# Patient Record
Sex: Female | Born: 2000
Health system: Southern US, Community
[De-identification: ages and names within clinical notes are randomized; demographics above are authoritative.]

## PROBLEM LIST (undated history)

## (undated) ENCOUNTER — Ambulatory Visit: Admission: EM | Payer: Commercial Managed Care - PPO | Source: Home / Self Care

## (undated) DIAGNOSIS — E559 Vitamin D deficiency, unspecified: Secondary | ICD-10-CM

## (undated) DIAGNOSIS — D229 Melanocytic nevi, unspecified: Secondary | ICD-10-CM

## (undated) HISTORY — DX: Vitamin D deficiency, unspecified: E55.9

## (undated) HISTORY — DX: Melanocytic nevi, unspecified: D22.9

---
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
## 2021-04-16 ENCOUNTER — Ambulatory Visit: Payer: Commercial Managed Care - PPO | Admitting: Family Medicine
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
## 2021-04-26 ENCOUNTER — Encounter: Payer: Self-pay | Admitting: Family Medicine

## 2021-04-26 ENCOUNTER — Ambulatory Visit: Payer: Commercial Managed Care - PPO | Admitting: Family Medicine

## 2021-04-26 DIAGNOSIS — Z113 Encounter for screening for infections with a predominantly sexual mode of transmission: Secondary | ICD-10-CM

## 2021-06-01 ENCOUNTER — Ambulatory Visit
Admission: EM | Admit: 2021-06-01 | Discharge: 2021-06-01 | Disposition: A | Discharge status: Home / Self Care | Payer: Commercial Managed Care - PPO | Attending: Emergency Medicine | Admitting: Emergency Medicine

## 2021-06-01 ENCOUNTER — Encounter: Payer: Self-pay | Admitting: Emergency Medicine

## 2021-06-01 ENCOUNTER — Other Ambulatory Visit: Payer: Self-pay

## 2021-06-01 ENCOUNTER — Ambulatory Visit: Payer: Self-pay | Admitting: *Deleted

## 2021-06-01 DIAGNOSIS — J029 Acute pharyngitis, unspecified: Secondary | ICD-10-CM | POA: Diagnosis present

## 2021-06-01 DIAGNOSIS — R6889 Other general symptoms and signs: Secondary | ICD-10-CM | POA: Diagnosis present

## 2021-06-01 DIAGNOSIS — R6883 Chills (without fever): Secondary | ICD-10-CM | POA: Diagnosis present

## 2021-06-01 DIAGNOSIS — Z20822 Contact with and (suspected) exposure to covid-19: Secondary | ICD-10-CM | POA: Insufficient documentation

## 2021-06-01 DIAGNOSIS — R051 Acute cough: Secondary | ICD-10-CM | POA: Diagnosis not present

## 2021-06-01 DIAGNOSIS — N644 Mastodynia: Secondary | ICD-10-CM | POA: Diagnosis not present

## 2021-06-01 LAB — RAPID INFLUENZA A&B ANTIGENS
Influenza A (ARMC): NEGATIVE
Influenza B (ARMC): NEGATIVE

## 2021-06-01 MED ORDER — NAPROXEN SODIUM 550 MG PO TABS: 550.0000 mg | ORAL_TABLET | Freq: Two times a day (BID) | ORAL | 0 refills | Status: DC | PRN

## 2021-06-01 MED ORDER — OSELTAMIVIR PHOSPHATE 75 MG PO CAPS: 75.0000 mg | ORAL_CAPSULE | Freq: Two times a day (BID) | ORAL | 0 refills | Status: AC

## 2021-06-01 NOTE — Telephone Encounter (Signed)
Pt went to UC for all symptoms

## 2021-06-01 NOTE — Discharge Instructions (Addendum)
Recommend start Tamiflu 75mg  twice a day as directed. May take Naproxen 550mg  every 12 hours as needed for pain/inflammation. Continue to push fluids to help loosen up mucus in sinuses and chest. Rest. Follow-up pending COVID 19 test results and in 3 to 4 days if not improving.

## 2021-06-01 NOTE — ED Triage Notes (Signed)
Patient reports breast and nipple pain that started on Tuesday.  Patient reports cough, runny nose, and chills that started on Wed.  Patient has not taken her temperature at home.

## 2021-06-01 NOTE — Telephone Encounter (Signed)
Pt called in c/o having shortness of breath, a sore throat a non productive cough and her left nipple being sore.  Also c/o her lower back hurting and a runny nose and chills.   Has not tested for Covid.  At first she said both of her nipples were hurting real bad but then at the end of the conversation she said it was only the left nipple.  No discharge, not breast feeding or pregnant.   On birth control.  Recent pregnancy test was negative.  This started on Tuesday her other URI symptoms started yesterday.    While trying to get her an appt with [place] the line went dead.   Not sure if she hung up or we got disconnected.   She was in a very noisy environment.  I had difficulty understanding everything she said and had to ask her to repeat several things.  I called her back.   "I'm sorry I accidentally hung up".  She called in before the office opened so I'm sending a note to see if she can be scheduled with Serafina Royals, NP for today.  [facility] does not schedule for her yet.   Pt was agreeable to this plan.  I instructed pt to go to the urgent care or ED if the shortness of breath became worse or she developed chest pain or a worsening of her symptoms.   She verbalized understanding.  Notes sent to [facility] for scheduling.g

## 2021-06-01 NOTE — Telephone Encounter (Signed)
Reason for Disposition  [1] MILD difficulty breathing (e.g., minimal/no SOB at rest, SOB with walking, pulse <100) AND [2] NEW-onset or WORSE than normal  Answer Assessment - Initial Assessment Questions 1. RESPIRATORY STATUS: "Describe your breathing?" (e.g., wheezing, shortness of breath, unable to speak, severe coughing)      Pt calling in c/o shortness of breath and coughing and sore throat.   My nipples are hurting real bad too.   2. ONSET: "When did this breathing problem begin?"      Yesterday started my symptoms.   I don't know if the nipple soreness is related or not.   I'm not breast feeding and I don't think I'm pregnant.    I am using birth control but it's not new.   3. PATTERN "Does the difficult breathing come and go, or has it been constant since it started?"      When standing or walking a long distance and after I cough I'm short of breath.  My back is hurting and I'm having cold chills.    My lower back.  4. SEVERITY: "How bad is your breathing?" (e.g., mild, moderate, severe)    - MILD: No SOB at rest, mild SOB with walking, speaks normally in sentences, can lie down, no retractions, pulse < 100.    - MODERATE: SOB at rest, SOB with minimal exertion and prefers to sit, cannot lie down flat, speaks in phrases, mild retractions, audible wheezing, pulse 100-120.    - SEVERE: Very SOB at rest, speaks in single words, struggling to breathe, sitting hunched forward, retractions, pulse > 120      Mild 5. RECURRENT SYMPTOM: "Have you had difficulty breathing before?" If Yes, ask: "When was the last time?" and "What happened that time?"      No   6. CARDIAC HISTORY: "Do you have any history of heart disease?" (e.g., heart attack, angina, bypass surgery, angioplasty)      No heart problems 7. LUNG HISTORY: "Do you have any history of lung disease?"  (e.g., pulmonary embolus, asthma, emphysema)     No lung problems No sick exposures.    8. CAUSE: "What do you think is causing the  breathing problem?"      I don't know.   I'm gay I don't think that has anything to do with it. 9. OTHER SYMPTOMS: "Do you have any other symptoms? (e.g., dizziness, runny nose, cough, chest pain, fever)     No diarrhea or vomiting.   Coughing non productive.    No fever.  I'm not hot.  I've used cough drops for the coughing.    10. O2 SATURATION MONITOR:  "Do you use an oxygen saturation monitor (pulse oximeter) at home?" If Yes, "What is your reading (oxygen level) today?" "What is your usual oxygen saturation reading?" (e.g., 95%)       No 11. PREGNANCY: "Is there any chance you are pregnant?" "When was your last menstrual period?"       I did a pregnancy test the other day and it was negative.    No nipple discharge or soreness.   It's just my left nipple.  Hurting since Tuesday.    I've not had a period for a year. 12. TRAVEL: "Have you traveled out of the country in the last month?" (e.g., travel history, exposures)       No sick exposures or travels.  Protocols used: Breathing Difficulty-A-AH

## 2021-06-01 NOTE — Telephone Encounter (Signed)
Pt declined appt when I let her know it would need be virtual
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
## 2021-06-02 LAB — SARS CORONAVIRUS 2 (TAT 6-24 HRS): SARS Coronavirus 2: NEGATIVE

## 2021-07-20 ENCOUNTER — Encounter: Payer: Self-pay | Admitting: Nurse Practitioner

## 2021-07-20 ENCOUNTER — Ambulatory Visit (INDEPENDENT_AMBULATORY_CARE_PROVIDER_SITE_OTHER): Payer: Commercial Managed Care - PPO | Admitting: Nurse Practitioner

## 2021-07-20 ENCOUNTER — Other Ambulatory Visit (HOSPITAL_COMMUNITY)
Admission: RE | Admit: 2021-07-20 | Discharge: 2021-07-20 | Disposition: A | Discharge status: Home / Self Care | Payer: Commercial Managed Care - PPO | Source: Ambulatory Visit | Attending: Family Medicine | Admitting: Family Medicine

## 2021-07-20 ENCOUNTER — Other Ambulatory Visit: Payer: Self-pay

## 2021-07-20 VITALS — BP 110/70 | HR 80 | Temp 97.6°F | Resp 18 | Ht 65.0 in | Wt 92.6 lb

## 2021-07-20 DIAGNOSIS — Z1159 Encounter for screening for other viral diseases: Secondary | ICD-10-CM

## 2021-07-20 DIAGNOSIS — Z118 Encounter for screening for other infectious and parasitic diseases: Secondary | ICD-10-CM | POA: Insufficient documentation

## 2021-07-20 DIAGNOSIS — Z131 Encounter for screening for diabetes mellitus: Secondary | ICD-10-CM | POA: Diagnosis not present

## 2021-07-20 DIAGNOSIS — Z130 Encounter for screening for diseases of the blood and blood-forming organs and certain disorders involving the immune mechanism: Secondary | ICD-10-CM | POA: Diagnosis not present

## 2021-07-20 DIAGNOSIS — Z0000 Encounter for general adult medical examination without abnormal findings: Secondary | ICD-10-CM

## 2021-07-20 DIAGNOSIS — Z13220 Encounter for screening for lipoid disorders: Secondary | ICD-10-CM

## 2021-07-20 NOTE — Progress Notes (Addendum)
Name: Melissa Hensley   MRN: [id number]    DOB: 2000/07/31   Date:07/20/2021       Progress Note  Subjective  Chief Complaint  Chief Complaint  Patient presents with   Annual Exam    HPI  Patient presents for annual CPE. Upon entering room, patient was not happy with this provider.  She would give one word answers.  Discussed how the physical would go.  Discussed lab work recommendations that patient later declined.  She initially declined breast exam and then changed her mind so that was completed.   Diet: fast food, does not eat a well balanced diet, does not intake dairy Exercise: gym 2 days a week, walking everyday Recommended 150 min a week of physical activity  Social determinants:  Patient declined to answer most questions.   [place] Office Visit from 07/20/2021 in [facility]  AUDIT-C Score 1      Depression: Phq 9 is  negative Depression screen [facility] 2/9 07/20/2021 09/13/2020 07/10/2020 07/05/2020 01/06/2020  Decreased Interest 0 0 0 0 0  Down, Depressed, Hopeless 0 0 0 0 0  PHQ - 2 Score 0 0 0 0 0  Altered sleeping 0 0 - 0 0  Tired, decreased energy 0 0 - 0 1  Change in appetite 0 0 - 0 0  Feeling bad or failure about yourself  0 0 - 0 0  Trouble concentrating 0 0 - 0 0  Moving slowly or fidgety/restless 0 0 - 0 0  Suicidal thoughts 0 0 - 0 0  PHQ-9 Score 0 0 - 0 1  Difficult doing work/chores Not difficult at all - - - Not difficult at all  Some recent data might be hidden   Hypertension: BP Readings from Last 3 Encounters:  07/20/21 110/70  06/01/21 114/75  09/13/20 96/78   Obesity: Wt Readings from Last 3 Encounters:  07/20/21 92 lb 9.6 oz (42 kg) (<1 %, Z= -2.53)*  06/01/21 100 lb (45.4 kg) (4 %, Z= -1.78)*  09/13/20 92 lb 9.6 oz (42 kg) (<1 %, Z= -2.47)*   * Growth percentiles are based on CDC (Girls, 2-20 Years) data.   BMI Readings from Last 3 Encounters:  07/20/21 15.41 kg/m (<1 %, Z= -3.46)*  06/01/21 16.64 kg/m (<1 %, Z=  -2.41)*  09/13/20 15.41 kg/m (<1 %, Z= -3.36)*   * Growth percentiles are based on CDC (Girls, 2-20 Years) data.     Vaccines:  HPV: up to at age 66 , ask insurance if age between 43-45  Shingrix: 43-64 yo and ask insurance if covered when patient above 5 yo Pneumonia:  educated and discussed with patient. Flu: educated and discussed with patient.  Hep C Screening: ordered STD testing and prevention (HIV/chl/gon/syphilis): 07/05/2019 Intimate partner violence:none Sexual History : yes, one partner Menstrual History/LMP/Abnormal Bleeding: [facility]: a year ago on Birth control Nexplanon Incontinence Symptoms: none  Breast cancer:  - Last Mammogram: no concerns, does not qualify - BRCA gene screening: none  Osteoporosis: Discussed high calcium and vitamin D supplementation, weight bearing exercises  Cervical cancer screening: no concerns, does not qualify  Skin cancer: Discussed monitoring for atypical lesions  Colorectal cancer: no concerns, does not qualify  Lung cancer:  Low Dose CT Chest recommended if Age 30-80 years, 20 pack-year currently smoking OR have quit w/in 15years. Patient does not qualify.   ECG: none  Advanced Care Planning: A voluntary discussion about advance care planning including the explanation and discussion of advance  directives.  Discussed health care proxy and Living will, and the patient was able to identify a health care proxy as Mom.  Patient does not have a living will at present time. If patient does have living will, I have requested they bring this to the clinic to be scanned in to their chart.  Lipids: Lab Results  Component Value Date   CHOL 135 07/05/2019   CHOL 121 07/03/2018   No results found for: HDL No results found for: LDLCALC No results found for: TRIG No results found for: CHOLHDL No results found for: LDLDIRECT  Glucose: Glucose, Bld  Date Value Ref Range Status  07/05/2019 89 65 - 99 mg/dL Final    Comment:    .             Fasting reference interval .     Patient Active Problem List   Diagnosis Date Noted   Vapes nicotine containing substance 11/24/2020   Plantar fasciitis 09/13/2020   Moderate episode of recurrent major depressive disorder ([place]) 08/26/2018   Grieving 08/26/2018   Problem with child being bullied 08/26/2018   Picky eater 04/23/2016   Vitamin D deficiency 04/23/2016    No past surgical history on file.  Family History  Problem Relation Age of Onset   Diabetes Paternal Uncle     Social History   Socioeconomic History   Marital status: Single    Spouse name: Not on file   Number of children: 0   Years of education: Not on file   Highest education level: Not on file  Occupational History   Occupation: student   Tobacco Use   Smoking status: Never   Smokeless tobacco: Never   Tobacco comments:    vapes  Vaping Use   Vaping Use: Every day  Substance and Sexual Activity   Alcohol use: No   Drug use: No   Sexual activity: Yes    Partners: Male    Birth control/protection: Implant  Other Topics Concern   Not on file  Social History Narrative   Parents divorced    Moved back with her mother and one brother    [employer]    Social Determinants of Health   Financial Resource Strain: Unknown   Difficulty of Paying Living Expenses: Patient refused  Food Insecurity: Unknown   Worried About Charity fundraiser in the Last Year: Patient refused   Arboriculturist in the Last Year: Patient refused  Transportation Needs: Unknown   Film/video editor (Medical): Patient refused   Lack of Transportation (Non-Medical): Patient refused  Physical Activity: Unknown   Days of Exercise per Week: Patient refused   Minutes of Exercise per Session: Patient refused  Stress: Unknown   Feeling of Stress : Patient refused  Social Connections: Unknown   Frequency of Communication with Friends and Family: Patient refused   Frequency of Social Gatherings with Friends and Family:  Patient refused   Attends Religious Services: Patient refused   Marine scientist or Organizations: Patient refused   Attends Music therapist: Patient refused   Marital Status: Patient refused  Intimate Production manager Violence: Unknown   Fear of Current or Ex-Partner: Patient refused   Emotionally Abused: Patient refused   Physically Abused: Patient refused   Sexually Abused: Patient refused     Current Outpatient Medications:    etonogestrel (NEXPLANON) 68 MG IMPL implant, 68 mg by Subdermal route once., Disp: , Rfl:    naproxen sodium (ANAPROX) 550  MG tablet, Take 1 tablet (550 mg total) by mouth every 12 (twelve) hours as needed for moderate pain. Take with food (Patient not taking: Reported on 07/20/2021), Disp: 15 tablet, Rfl: 0  Allergies  Allergen Reactions   Red Dye Diarrhea    ROS  Constitutional: Negative for fever or weight change.  Respiratory: Negative for cough and shortness of breath.   Cardiovascular: Negative for chest pain or palpitations.  Gastrointestinal: Negative for abdominal pain, no bowel changes.  Musculoskeletal: Negative for gait problem or joint swelling.  Skin: Negative for rash.  Neurological: Negative for dizziness or headache.  No other specific complaints in a complete review of systems (except as listed in HPI above).   Objective  Vitals:   07/20/21 0814  BP: 110/70  Pulse: 80  Resp: 18  Temp: 97.6 F (36.4 C)  TempSrc: Oral  SpO2: 99%  Weight: 92 lb 9.6 oz (42 kg)  Height: '5\' 5"'  (1.651 m)    Body mass index is 15.41 kg/m.  Physical Exam  Constitutional: Patient appears well-developed and well-nourished. No distress.  HENT: Head: Normocephalic and atraumatic. Ears: B TMs ok, no erythema or effusion; Nose: Nose normal. Mouth/Throat: not done Eyes: Conjunctivae and EOM are normal. Pupils are equal, round, and reactive to light. No scleral icterus.  Neck: Normal range of motion. Neck supple. No JVD present. No  thyromegaly present.  Cardiovascular: Normal rate, regular rhythm and normal heart sounds.  No murmur heard. No BLE edema. Pulmonary/Chest: Effort normal and breath sounds normal. No respiratory distress. Abdominal: Soft. Bowel sounds are normal, no distension. There is no tenderness. no masses Breast: no lumps or masses, no nipple discharge or rashes FEMALE GENITALIA: not done RECTAL: not done Musculoskeletal: Normal range of motion, no joint effusions. No gross deformities Neurological: he is alert and oriented to person, place, and time. No cranial nerve deficit. Coordination, balance, strength, speech and gait are normal.  Skin: Skin is warm and dry. No rash noted. No erythema.  Psychiatric: Patient has a normal mood and affect. behavior is normal. Judgment and thought content normal.   Recent Results (from the past 2160 hour(s))  Rapid Influenza A&B Antigens     Status: None   Collection Time: 06/01/21 11:24 AM   Specimen: Flu Kit Nasopharyngeal Swab; Respiratory  Result Value Ref Range   Influenza A ([facility]) NEGATIVE NEGATIVE   Influenza B ([facility]) NEGATIVE NEGATIVE    Comment: Negative results do not exclude influenza virus infection, and influenza should still be considered if clinical suspicion is high. Performed at [facility], [address], [place], [place] [address]   SARS CORONAVIRUS 2 (TAT 6-24 HRS) Nasopharyngeal Nasopharyngeal Swab     Status: None   Collection Time: 06/01/21 11:24 AM   Specimen: Nasopharyngeal Swab  Result Value Ref Range   SARS Coronavirus 2 NEGATIVE NEGATIVE    Comment: (NOTE) SARS-CoV-2 target nucleic acids are NOT DETECTED.  The SARS-CoV-2 RNA is generally detectable in upper and lower respiratory specimens during the acute phase of infection. Negative results do not preclude SARS-CoV-2 infection, do not rule out co-infections with other pathogens, and should not be used as the sole basis for treatment or other patient management  decisions. Negative results must be combined with clinical observations, patient history, and epidemiological information. The expected result is Negative.  Fact Sheet for Patients: SugarRoll.be  Fact Sheet for Healthcare Providers: https://www.woods-mathews.com/  This test is not yet approved or cleared by the [place] FDA and  has  been authorized for detection and/or diagnosis of SARS-CoV-2 by FDA under an Emergency Use Authorization (EUA). This EUA will remain  in effect (meaning this test can be used) for the duration of the COVID-19 declaration under Se ction 564(b)(1) of the Act, 21 U.S.C. section 360bbb-3(b)(1), unless the authorization is terminated or revoked sooner.  Performed at [facility], [place] [address], [place], [place] [address]      Fall Risk: Fall Risk  07/20/2021 09/13/2020 07/10/2020 07/05/2020 01/06/2020  Falls in the past year? 0 0 0 0 0  Number falls in past yr: 0 0 0 0 0  Injury with Fall? 0 0 0 0 0  Follow up Falls evaluation completed - Falls evaluation completed - -    Functional Status Survey: Is the patient deaf or have difficulty hearing?: No Does the patient have difficulty seeing, even when wearing glasses/contacts?: No Does the patient have difficulty concentrating, remembering, or making decisions?: No Does the patient have difficulty walking or climbing stairs?: No Does the patient have difficulty dressing or bathing?: No Does the patient have difficulty doing errands alone such as visiting a doctor's office or shopping?: No   Assessment & Plan  1. Annual physical exam  - CBC with Differential/Platelet - COMPLETE METABOLIC PANEL WITH GFR - Lipid panel - Hemoglobin A1c - Hepatitis C antibody  2. Encounter for hepatitis C screening test for low risk patient  - Hepatitis C antibody  3. Screening for deficiency anemia  - CBC with Differential/Platelet  4. Screening for diabetes  mellitus  - COMPLETE METABOLIC PANEL WITH GFR - Hemoglobin A1c  5. Screening for cholesterol level  - COMPLETE METABOLIC PANEL WITH GFR - Lipid panel  6. Screening for chlamydial disease  - Urine cytology ancillary only   -USPSTF grade A and B recommendations reviewed with patient; age-appropriate recommendations, preventive care, screening tests, etc discussed and encouraged; healthy living encouraged; see AVS for patient education given to patient -Discussed importance of 150 minutes of physical activity weekly, eat two servings of fish weekly, eat one serving of tree nuts ( cashews, pistachios, pecans, almonds.Marland Kitchen) every other day, eat 6 servings of fruit/vegetables daily and drink plenty of water and avoid sweet beverages.

## 2021-07-21 ENCOUNTER — Other Ambulatory Visit: Payer: Self-pay | Admitting: Nurse Practitioner

## 2021-07-21 DIAGNOSIS — A749 Chlamydial infection, unspecified: Secondary | ICD-10-CM

## 2021-07-21 LAB — URINE CYTOLOGY ANCILLARY ONLY
Chlamydia: POSITIVE — AB
Comment: NEGATIVE
Comment: NORMAL
Neisseria Gonorrhea: NEGATIVE

## 2021-07-21 MED ORDER — DOXYCYCLINE HYCLATE 100 MG PO TABS: 100.0000 mg | ORAL_TABLET | Freq: Two times a day (BID) | ORAL | 0 refills | Status: AC

## 2021-09-17 ENCOUNTER — Ambulatory Visit: Payer: Self-pay | Admitting: *Deleted

## 2021-09-17 ENCOUNTER — Telehealth: Payer: Self-pay

## 2021-09-17 NOTE — Telephone Encounter (Signed)
I called pt to ask if she would like to see another dr before 09/24/21 and pt stated no she would wait for dr Ancil Boozer ?

## 2021-09-17 NOTE — Telephone Encounter (Signed)
Summary: lump on her breast sore to touch  ? Pt mother called in and stated pt found a lump on her breast that was sore to touch last week.  ? ?Pt mother stated they did not want to come in for an appointment; they are seeking clinical advice and a referral to a specialist.   ? ?Sent referral request to the practice.  ?  ? ? ? ?Chief Complaint: requesting referral ?Symptoms: right lump on breast, sore to touch  ?Frequency: x 1 week  ?Pertinent Negatives: Patient denies no fever, no discharge from nipple, no rash reported no redness reported  ?Disposition: '[]'$ ED /'[]'$ Urgent Care (no appt availability in office) / '[x]'$ Appointment(In office/virtual)/ '[]'$  [place] [facility]/ '[]'$ Home Care/ '[]'$ Refused Recommended Disposition /'[]'$ [place] [facility]/ '[]'$  Follow-up with PCP ?Additional Notes:  ? ?Requesting if referral is needed for specialist, already seen 08/03/20. Appt scheduled 09/24/21.  Earlier appt noted with PCP.  Please advise if earlier appt can be given after 3 pm.  ? ? ? ? ? ?Reason for Disposition ? Breast lump ? ?Answer Assessment - Initial Assessment Questions ?1. SYMPTOM: "What's the main symptom you're concerned about?"  (e.g., lump, pain, rash, nipple discharge) ?    Lump and soreness to touch  ?2. LOCATION: "Where is the pain/ lump located?" ?    Right breast  ?3. ONSET: "When did pain/ lump  start?" ?    One week ago  ?4. PRIOR HISTORY: "Do you have any history of prior problems with your breasts?" (e.g., lumps, cancer, fibrocystic breast disease) ?    Lump  ?5. CAUSE: "What do you think is causing this symptom?" ?    Not sure , started menstrual cycle Monday last week  ?6. OTHER SYMPTOMS: "Do you have any other symptoms?" (e.g., fever, breast pain, redness or rash, nipple discharge) ?    Breast pain/ soreness to touch, lump ?7. PREGNANCY-BREASTFEEDING: "Is there any chance you are pregnant?" "When was your last menstrual period?" "Are you breastfeeding?" ?    na ? ?Protocols used: Breast Symptoms-A-AH ? ?

## 2021-09-17 NOTE — Telephone Encounter (Signed)
Called pt and offered Thursday appt and pt declined ?

## 2021-09-17 NOTE — Telephone Encounter (Signed)
Copied from [facility] [id number]. Topic: Referral - Request for Referral ?>> Sep 17, 2021 11:22 AM McGill, Nelva Bush wrote: ?Has patient seen PCP for this complaint? Yes.   ?*If NO, is insurance requiring patient see PCP for this issue before PCP can refer them? ?Referral for which specialty: N/A Pt mother stated PCP sent pt somewhere in December 2022.  ?Preferred provider/office: Dr. Fleet Contras at [facility] ?Reason for referral:pt found a lump on her breast that was sore to touch last week. ?

## 2021-09-17 NOTE — Telephone Encounter (Signed)
Called pt to offer thursdays appt and she declined it ?

## 2021-09-20 ENCOUNTER — Encounter: Payer: Self-pay | Admitting: Advanced Practice Midwife

## 2021-09-20 ENCOUNTER — Ambulatory Visit: Payer: Commercial Managed Care - PPO | Admitting: Advanced Practice Midwife

## 2021-09-20 ENCOUNTER — Other Ambulatory Visit: Payer: Self-pay

## 2021-09-20 DIAGNOSIS — Z113 Encounter for screening for infections with a predominantly sexual mode of transmission: Secondary | ICD-10-CM

## 2021-09-20 LAB — WET PREP FOR TRICH, YEAST, CLUE
Trichomonas Exam: NEGATIVE
Yeast Exam: NEGATIVE

## 2021-09-20 NOTE — Progress Notes (Signed)
Name: Melissa Hensley   MRN: [id number]    DOB: [birth year]   Date:09/24/2021 ? ?     Progress Note ? ?Subjective ? ?Chief Complaint ? ?Breast Lump- Right ? ?HPI ? ?Right breast lump: she noticed a lump on right breast about two weeks ago it is tender to touch, no nipple discharge, denies change of skin or redness.  ? ?She has a history of breast lumps in the past ? ?She went one year without a cycle but had one about one month ago and again over this past weekend ? ?She has Nexplanon for a while, no new medications.  ? ?Patient Active Problem List  ? Diagnosis Date Noted  ? Vapes nicotine containing substance 11/24/2020  ? Plantar fasciitis 09/13/2020  ? Moderate episode of recurrent major depressive disorder ([place]) 08/26/2018  ? Grieving 08/26/2018  ? Problem with child being bullied 08/26/2018  ? Picky eater 04/23/2016  ? Vitamin D deficiency 04/23/2016  ? ? ?No past surgical history on file. ? ?Family History  ?Problem Relation Age of Onset  ? Diabetes Paternal Uncle   ? ? ?Social History  ? ?Tobacco Use  ? Smoking status: Every Day  ?  Types: E-cigarettes, Cigars  ? Smokeless tobacco: Never  ? Tobacco comments:  ?  vapes  ?Substance Use Topics  ? Alcohol use: No  ? ? ? ?Current Outpatient Medications:  ?  etonogestrel (NEXPLANON) 68 MG IMPL implant, 68 mg by Subdermal route once., Disp: , Rfl:  ? ?Allergies  ?Allergen Reactions  ? Red Dye Diarrhea  ? ? ?I personally reviewed active problem list, medication list, allergies, family history, social history, health maintenance with the patient/caregiver today. ? ? ?ROS ? ?Ten systems reviewed and is negative except as mentioned in HPI  ? ?Objective ? ?Vitals:  ? 09/24/21 1552  ?BP: 112/72  ?Pulse: 86  ?Resp: 16  ?SpO2: 99%  ?Weight: 98 lb (44.5 kg)  ?Height: '5\' 5"'$  (1.651 m)  ? ? ?Body mass index is 16.31 kg/m?. ? ?Physical Exam ? ?Constitutional: Patient appears well-developed and very thin No distress.  ?HEENT: head atraumatic, normocephalic, pupils equal and reactive to  light, neck supple, ?Cardiovascular: Normal rate, regular rhythm and normal heart sounds.  No murmur heard. No BLE edema. ?Breast : multiple pea size nodules on both breasts, no nipple discharge, skin intact, the area of discomfort, near right axilla was normal to exam, she states seems to be going away ?Pulmonary/Chest: Effort normal and breath sounds normal. No respiratory distress. ?Abdominal: Soft.  There is no tenderness. ?Psychiatric: Patient has a normal mood and affect. behavior is normal. Judgment and thought content normal.  ? ?Recent Results (from the past 2160 hour(s))  ?Urine cytology ancillary only     Status: Abnormal  ? Collection Time: 07/20/21  8:41 AM  ?Result Value Ref Range  ? Neisseria Gonorrhea Negative   ? Chlamydia Positive (A)   ? Comment Normal Reference [place] Chlamydia - Negative   ? Comment    ?  Normal Reference Range Neisseria Gonorrhea - Negative  ?WET PREP FOR [place], YEAST, CLUE     Status: None  ? Collection Time: 09/20/21  5:01 PM  ?Result Value Ref Range  ? Trichomonas Exam Negative Negative  ? Yeast Exam Negative Negative  ? Clue Cell Exam Comment: Negative  ?  Comment: NEG;AMINE NEG  ? ? ?PHQ2/9: ?Depression screen [facility] 2/9 09/24/2021 07/20/2021 09/13/2020 07/10/2020 07/05/2020  ?Decreased Interest 0 0 0 0 0  ?Down, Depressed, Hopeless 0  0 0 0 0  ?PHQ - 2 Score 0 0 0 0 0  ?Altered sleeping 0 0 0 - 0  ?Tired, decreased energy 0 0 0 - 0  ?Change in appetite 0 0 0 - 0  ?Feeling bad or failure about yourself  0 0 0 - 0  ?Trouble concentrating 0 0 0 - 0  ?Moving slowly or fidgety/restless 0 0 0 - 0  ?Suicidal thoughts 0 0 0 - 0  ?PHQ-9 Score 0 0 0 - 0  ?Difficult doing work/chores - Not difficult at all - - -  ?Some recent data might be hidden  ?  ?phq 9 is negative ? ? ?Fall Risk: ?Fall Risk  09/24/2021 07/20/2021 09/13/2020 07/10/2020 07/05/2020  ?Falls in the past year? 0 0 0 0 0  ?Number falls in past yr: 0 0 0 0 0  ?Injury with Fall? 0 0 0 0 0  ?Risk for fall due to : No Fall Risks - - -  -  ?Follow up Falls prevention discussed Falls evaluation completed - Falls evaluation completed -  ? ? ? ? ?Functional Status Survey: ?Is the patient deaf or have difficulty hearing?: No ?Does the patient have difficulty seeing, even when wearing glasses/contacts?: No ?Does the patient have difficulty concentrating, remembering, or making decisions?: No ?Does the patient have difficulty walking or climbing stairs?: No ?Does the patient have difficulty dressing or bathing?: No ?Does the patient have difficulty doing errands alone such as visiting a doctor's office or shopping?: No ? ? ? ?Assessment & Plan ? ?1. Multiple benign lumps of breast ? ?Reassurance given at this time ? ?2. Acute breast pain ?  ?Reassurance given  ?

## 2021-09-20 NOTE — Progress Notes (Signed)
Here today for STD screening. Declines bloodwork. Hal Morales, RN ? ?

## 2021-09-20 NOTE — Progress Notes (Signed)
[facility] ? ?STI clinic/screening visit ?[place] [address] ?[id number] ? ?Subjective:  ?Melissa Hensley is a 21 y.o. SBF nullip vaper female being seen today for an STI screening visit. The patient reports they do not have symptoms.  Patient reports that they do not desire a pregnancy in the next year.   They reported they are not interested in discussing contraception today.   ? ?Patient's last menstrual period was 09/11/2021 (exact date). ? ? ?Patient has the following medical conditions:   ?Patient Active Problem List  ? Diagnosis Date Noted  ? Vapes nicotine containing substance 11/24/2020  ? Plantar fasciitis 09/13/2020  ? Moderate episode of recurrent major depressive disorder ([place]) 08/26/2018  ? Grieving 08/26/2018  ? Problem with child being bullied 08/26/2018  ? Picky eater 04/23/2016  ? Vitamin D deficiency 04/23/2016  ? ? ?Chief Complaint  ?Patient presents with  ? SEXUALLY TRANSMITTED DISEASE  ?  Screening  ? ? ?HPI ? ?Patient reports asymptomatic. LMP 09/11/21. Last sex 09/08/21 without condom; with current partner x 1 year; 1 partner in last 3 mo. Nexplanon inserted 04/05/19. Last cigar 07/2021.  ? ?Last HIV test per patient/review of record was 07/05/20 ?Patient reports last pap was n/a ? ?Screening for MPX risk: ?Does the patient have an unexplained rash? No ?Is the patient MSM? No ?Does the patient endorse multiple sex partners or anonymous sex partners? No ?Did the patient have close or sexual contact with a person diagnosed with MPX? No ?Has the patient traveled outside the [place] where MPX is endemic? No ?Is there a high clinical suspicion for MPX-- evidenced by one of the following No ? -Unlikely to be chickenpox ? -Lymphadenopathy ? -Rash that present in same phase of evolution on any given body part ?See flowsheet for further details and programmatic requirements.  ? ? ?The following portions of the patient's history were reviewed and updated as  appropriate: allergies, current medications, past medical history, past social history, past surgical history and problem list. ? ?Objective:  ?There were no vitals filed for this visit. ? ?Physical Exam ?Vitals and nursing note reviewed.  ?Constitutional:   ?   Appearance: Normal appearance. She is normal weight.  ?HENT:  ?   Head: Normocephalic and atraumatic.  ?   Mouth/Throat:  ?   Mouth: Mucous membranes are moist.  ?   Pharynx: Oropharynx is clear. No oropharyngeal exudate or posterior oropharyngeal erythema.  ?Eyes:  ?   Conjunctiva/sclera: Conjunctivae normal.  ?Pulmonary:  ?   Effort: Pulmonary effort is normal.  ?Abdominal:  ?   General: Abdomen is flat.  ?   Palpations: Abdomen is soft. There is no mass.  ?   Tenderness: There is no abdominal tenderness. There is no rebound.  ?   Comments: Soft without masses or tenderness, good tone  ?Genitourinary: ?   General: Normal vulva.  ?   Exam position: Lithotomy position.  ?   Pubic Area: No rash or pubic lice.   ?   Labia:     ?   Right: No rash or lesion.     ?   Left: No rash or lesion.   ?   Vagina: Vaginal discharge (white creamy leukorrhea, ph<4.5) present. No erythema, bleeding or lesions.  ?   Cervix: Normal.  ?   Uterus: Normal.   ?   Adnexa: Right adnexa normal and left adnexa normal.  ?   Rectum: Normal.  ?Lymphadenopathy:  ?  Head:  ?   Right side of head: No preauricular or posterior auricular adenopathy.  ?   Left side of head: No preauricular or posterior auricular adenopathy.  ?   Cervical: No cervical adenopathy.  ?   Right cervical: No superficial, deep or posterior cervical adenopathy. ?   Left cervical: No superficial, deep or posterior cervical adenopathy.  ?   Upper Body:  ?   Right upper body: No supraclavicular or axillary adenopathy.  ?   Left upper body: No supraclavicular or axillary adenopathy.  ?   Lower Body: No right inguinal adenopathy. No left inguinal adenopathy.  ?Skin: ?   General: Skin is warm and dry.  ?   Findings: No  rash.  ?Neurological:  ?   Mental Status: She is alert and oriented to person, place, and time.  ? ? ? ?Assessment and Plan:  ?Rashad Obeid is a 21 y.o. female presenting to the [facility] Department for STI screening ? ?1. Screening examination for venereal disease ?Treat wet mount per standing orders ?Immunization nurse consult ?- WET PREP FOR [place], YEAST, CLUE ?- Chlamydia/Gonorrhea [facility] ?- Gonococcus culture ? ? ? ? ?No follow-ups on file. ? ?Future Appointments  ?Date Time Provider [place]  ?09/24/2021  3:40 PM Steele Sizer, MD [place] [facility]  ?07/26/2022  8:20 AM Steele Sizer, MD [place] [facility]  ? ? ?Herbie Saxon, CNM ? ?

## 2021-09-20 NOTE — Progress Notes (Signed)
[employer] results reviewed. Per standing order no treatment indicated. Hal Morales, RN ? ?

## 2021-09-24 ENCOUNTER — Ambulatory Visit: Payer: Commercial Managed Care - PPO | Admitting: Family Medicine

## 2021-09-24 ENCOUNTER — Encounter: Payer: Self-pay | Admitting: Family Medicine

## 2021-09-24 VITALS — BP 112/72 | HR 86 | Resp 16 | Ht 65.0 in | Wt 98.0 lb

## 2021-09-24 DIAGNOSIS — N63 Unspecified lump in unspecified breast: Secondary | ICD-10-CM

## 2021-09-24 DIAGNOSIS — N644 Mastodynia: Secondary | ICD-10-CM | POA: Diagnosis not present

## 2021-09-25 LAB — GONOCOCCUS CULTURE

## 2021-09-27 ENCOUNTER — Telehealth: Payer: Self-pay | Admitting: Family Medicine

## 2021-09-27 NOTE — Telephone Encounter (Signed)
Phone call to pt. Pt confirmed password from last visit. Pt provided TR that are available, but also informed that some results are still pending.  [facility] will call about results that are positive or if any problems.  It could be up to 3 weeks before all TR are back. ?

## 2021-10-05 ENCOUNTER — Encounter: Payer: Self-pay | Admitting: Advanced Practice Midwife

## 2021-10-05 ENCOUNTER — Ambulatory Visit (LOCAL_COMMUNITY_HEALTH_CENTER): Payer: Commercial Managed Care - PPO | Admitting: Advanced Practice Midwife

## 2021-10-05 ENCOUNTER — Ambulatory Visit: Payer: Self-pay

## 2021-10-05 ENCOUNTER — Other Ambulatory Visit: Payer: Self-pay

## 2021-10-05 VITALS — BP 107/70 | Ht 65.0 in | Wt 100.4 lb

## 2021-10-05 DIAGNOSIS — Z3009 Encounter for other general counseling and advice on contraception: Secondary | ICD-10-CM

## 2021-10-05 DIAGNOSIS — Z3046 Encounter for surveillance of implantable subdermal contraceptive: Secondary | ICD-10-CM

## 2021-10-05 NOTE — Progress Notes (Signed)
Pt here for Nexplanon removal .  Pt declines any other forms of  birth control.  Windle Guard, RN ? ?

## 2021-10-05 NOTE — Progress Notes (Signed)
[facility] ?[place] ?Main Number: [phone] ? ?Contraception/Family Planning VISIT ENCOUNTER NOTE ? ?Subjective:  ? Melissa Hensley is a 21 y.o. vaper SBF [id number] female here for reproductive life counseling. The patient is currently using Hormonal Implant to prevent pregnancy.   ?Nexplanon inserted 04/05/2019 and pt wants removal with no birth control and doesn't want pregnancy in next year. Last sex 09/01/21 without condom; not with that partner anymore. LMP 09/24/2021. Last [facility]21 at [facility]. Working 40 hrs/wk and living alone. Hasn't eaten anything yet all day (4:35 pm) and has food at home.  ?The patient does not want a pregnancy in the next year.   ? ?Client states they are looking for the following:  Other no birth control at all ? ?Denies abnormal vaginal bleeding, discharge, pelvic pain, problems with intercourse or other gynecologic concerns.  ?  ?Gynecologic History ?Patient's last menstrual period was 09/24/2021 (approximate). ? ?Health Maintenance Due  ?Topic Date Due  ? HPV VACCINES (3 - 3-dose series) 04/27/2015  ? Hepatitis C Screening  Never done  ? ? ? ?The following portions of the patient's history were reviewed and updated as appropriate: allergies, current medications, past family history, past medical history, past social history, past surgical history and problem list. ? ?Review of Systems ?Pertinent items are noted in HPI. ?  ?Objective:  ?BP 107/70   Ht '5\' 5"'$  (1.651 m)   Wt 100 lb 6.4 oz (45.5 kg)   LMP 09/24/2021 (Approximate)   BMI 16.71 kg/m?  ?Gen: well appearing, NAD ?HEENT: no scleral icterus ?CV: RR ?Lung: Normal WOB ?Ext: warm well perfused ? ? ? ? ?Assessment and Plan:  ? ?Need for emergency contraceptive care was assessed today; does not meet criteria ? ?Last unprotected sex was:  09/01/21 without condom.   ? ?Contraception counseling: Reviewed methods in a patient centered fashion and used shared  decision making with the patient. Utilized [employer] patient education tools as appropriate. ?The patient stated there goals and desires from a method are: Other no birth control ? ?We reviewed the following methods in detail based on patient preferences available included: ?No Method - Other Reason ? ?Patient expressed they would like No Method - Other Reason ? ?This was provided to the patient today.  if not why not clearly documented ? ?Risks, benefits, and typical effectiveness rates were reviewed.  Questions were answered.  Written information was also given to the patient to review.   ? ? ?  will follow up in  1 years for surveillance.   was told to call with any further questions, or with any concerns about this method of contraception or cycle control.  Emphasized use of condoms 100% of the time for STI prevention. ? ? ?1. Nexplanon removal ?Nexplanon Removal ?Patient identified, informed consent performed, consent signed.   Appropriate time out taken. Nexplanon site identified.  Area prepped in usual sterile fashon. 3 ml of 1% lidocaine with Epinephrine was used to anesthetize the area at the distal end of the implant and along implant site. A small stab incision was made right beside the implant on the distal portion.  The Nexplanon rod was grasped using straight hemostats/manual and removed without difficulty.  There was minimal blood loss. There were no complications.  Steri-strips were applied over the small incision.  A pressure bandage was applied to reduce any bruising.  The patient tolerated the procedure well and was given post procedure instructions.    ?  Nexplanon:  ? ?Counseled patient to take OTC analgesic starting as soon as lidocaine starts to wear off and take regularly for at least 48 hr to decrease discomfort.  Specifically to take with food or milk to decrease stomach upset and for IB 600 mg (3 tablets) every 6 hrs; IB 800 mg (4 tablets) every 8 hrs; or Aleve 2 tablets every 12 hrs. ?  ?   ? ?Please refer to After Visit Summary for other counseling recommendations.  ? ?Return in about 10 months (around 07/24/2022) for yearly physical exam. ? ?Herbie Saxon, CNM ?[place] ?

## 2022-07-25 NOTE — Progress Notes (Signed)
Name: Melissa Hensley   MRN: [id number]    DOB: 2001-05-17   Date:07/26/2022       Progress Note  Subjective  Chief Complaint  Annual Exam  HPI  Patient presents for annual CPE she has been dating for the past year and got pregnant Fall 2023 LMP 04/26/2022 but found out she was pregnant in Dec.  She stopped vaping, has not been drinking since. Denies drug use. She is taking otc prenatal vitamin and would like to be referred to [place]: she is a picky eater.  Exercise: discussed importance of regular activity likely daily walk for 30 minutes   Last Eye Exam: up to date  Last Dental Exam: up to date   Not currently drinking  [place] Office Visit from [facility]/26/2022 in [facility]  AUDIT-C Score 0      Depression: Phq 9 is  negative    07/26/2022    8:12 AM 10/05/2021    3:32 PM 09/24/2021    3:52 PM 07/20/2021    8:15 AM 09/13/2020    2:20 PM  Depression screen PHQ 2/9  Decreased Interest 0 0 0 0 0  Down, Depressed, Hopeless 0 0 0 0 0  PHQ - 2 Score 0 0 0 0 0  Altered sleeping 0  0 0 0  Tired, decreased energy 0  0 0 0  Change in appetite 0  0 0 0  Feeling bad or failure about yourself  0  0 0 0  Trouble concentrating 0  0 0 0  Moving slowly or fidgety/restless 0  0 0 0  Suicidal thoughts 0  0 0 0  PHQ-9 Score 0  0 0 0  Difficult doing work/chores Not difficult at all   Not difficult at all    Hypertension: BP Readings from Last 3 Encounters:  07/26/22 116/70  10/05/21 107/70  09/24/21 112/72   Obesity: Wt Readings from Last 3 Encounters:  07/26/22 107 lb 6.4 oz (48.7 kg)  10/05/21 100 lb 6.4 oz (45.5 kg) (4 %, Z= -1.77)*  09/24/21 98 lb (44.5 kg) (2 %, Z= -1.99)*   * Growth percentiles are based on CDC (Girls, 2-20 Years) data.   BMI Readings from Last 3 Encounters:  07/26/22 17.87 kg/m  10/05/21 16.71 kg/m (<1 %, Z= -2.37)*  09/24/21 16.31 kg/m (<1 %, Z= -2.68)*   * Growth percentiles are based on CDC (Girls, 2-20  Years) data.     Vaccines:   HPV: 2 doses Tdap: up to date Shingrix: N/A Pneumonia:  N/A Flu: 2014, declined COVID-19: N/A   Hep C Screening: N/A STD testing and prevention (HIV/chl/gon/syphilis): 07/05/19 Intimate partner violence: negative screen  Sexual History : Menstrual History/LMP/Abnormal Bleeding:  Discussed importance of follow up if any post-menopausal bleeding: not applicable  Incontinence Symptoms: negative for symptoms   Breast cancer:  - Last Mammogram: N/A - BRCA gene screening: N/A   Osteoporosis Prevention : Discussed high calcium and vitamin D supplementation, weight bearing exercises Bone density: N/A   Cervical cancer screening: N/A  Skin cancer: Discussed monitoring for atypical lesions  Colorectal cancer: N/A   Lung cancer:  Low Dose CT Chest recommended if Age 39-80 years, 20 pack-year currently smoking OR have quit w/in 15years. Patient does not qualify for screen   ECG: N/A  Advanced Care Planning: A voluntary discussion about advance care planning including the explanation and discussion of advance directives.  Discussed health care proxy and Living will,  and the patient was able to identify a health care proxy as mother .  Patient does not have a living will and power of attorney of health care   Lipids: Lab Results  Component Value Date   CHOL 135 07/05/2019   CHOL 121 07/03/2018   No results found for: "HDL" No results found for: "[place]" No results found for: "TRIG" No results found for: "CHOLHDL" No results found for: "LDLDIRECT"  Glucose: Glucose, Bld  Date Value Ref Range Status  07/05/2019 89 65 - 99 mg/dL Final    Comment:    .            Fasting reference interval .     Patient Active Problem List   Diagnosis Date Noted   Vapes nicotine containing substance 11/24/2020   Plantar fasciitis 09/13/2020   Moderate episode of recurrent major depressive disorder ([place]) 08/26/2018   Grieving 08/26/2018   Problem with child  being bullied 08/26/2018   Picky eater 04/23/2016   Vitamin D deficiency 04/23/2016    History reviewed. No pertinent surgical history.  Family History  Problem Relation Age of Onset   Diabetes Paternal Uncle     Social History   Socioeconomic History   Marital status: Single    Spouse name: Not on file   Number of children: 0   Years of education: Not on file   Highest education level: Not on file  Occupational History   Occupation: student     Employer: [employer]  Tobacco Use   Smoking status: Former    Types: E-cigarettes    Quit date: 06/25/2022    Years since quitting: 0.0   Smokeless tobacco: Never  Vaping Use   Vaping Use: Every day   Substances: Nicotine, Flavoring  Substance and Sexual Activity   Alcohol use: Not Currently   Drug use: Never   Sexual activity: Yes    Partners: Male    Birth control/protection: None  Other Topics Concern   Not on file  Social History Narrative   Parents divorced    Moved back with her mother and one brother    Social Determinants of Health   Financial Resource Strain: Low Risk  (07/26/2022)   Overall Financial Resource Strain (CARDIA)    Difficulty of Paying Living Expenses: Not hard at all  Food Insecurity: No Food Insecurity (07/26/2022)   Hunger Vital Sign    Worried About Running Out of Food in the Last Year: Never true    [place] in the Last Year: Never true  Transportation Needs: No Transportation Needs (07/26/2022)   PRAPARE - Hydrologist (Medical): No    Lack of Transportation (Non-Medical): No  Physical Activity: Inactive (07/26/2022)   Exercise Vital Sign    Days of Exercise per Week: 0 days    Minutes of Exercise per Session: 0 min  Stress: No Stress Concern Present (07/26/2022)   [place]    Feeling of Stress : Not at all  Social Connections: Moderately Isolated (07/26/2022)   Social Connection  and Isolation Panel [NHANES]    Frequency of Communication with Friends and Family: More than three times a week    Frequency of Social Gatherings with Friends and Family: More than three times a week    Attends Religious Services: More than 4 times per year    Active Member of [employer] or Organizations: No    Attends Club or  Organization Meetings: Never    Marital Status: Never married  Intimate Partner Violence: Not At Risk (07/26/2022)   Humiliation, Afraid, Rape, and Kick questionnaire    Fear of Current or Ex-Partner: No    Emotionally Abused: No    Physically Abused: No    Sexually Abused: No    No current outpatient medications on file.  Allergies  Allergen Reactions   Red Dye Diarrhea     ROS  Constitutional: Negative for fever or weight change.  Respiratory: Negative for cough and shortness of breath.   Cardiovascular: Negative for chest pain or palpitations.  Gastrointestinal: Negative for abdominal pain, no bowel changes. She has some nausea since pregnant Musculoskeletal: Negative for gait problem or joint swelling.  Skin: Negative for rash.  Neurological: Negative for dizziness , she has noticed some  headache.  No other specific complaints in a complete review of systems (except as listed in HPI above).    Objective  Vitals:   07/26/22 0812  BP: 116/70  Pulse: (!) 106  Resp: 16  Temp: 98.6 F (37 C)  TempSrc: Oral  SpO2: 99%  Weight: 107 lb 6.4 oz (48.7 kg)  Height: '5\' 5"'$  (1.651 m)    Body mass index is 17.87 kg/m.  Physical Exam  Constitutional: Patient appears well-developed and well-nourished. No distress.  HENT: Head: Normocephalic and atraumatic. Ears: B TMs ok, no erythema or effusion; Nose: Nose normal. Mouth/Throat: Oropharynx is clear and moist. No oropharyngeal exudate.  Eyes: Conjunctivae and EOM are normal. Pupils are equal, round, and reactive to light. No scleral icterus.  Neck: Normal range of motion. Neck supple. No JVD present. No  thyromegaly present.  Cardiovascular: Normal rate, regular rhythm and normal heart sounds.  No murmur heard. No BLE edema. Pulmonary/Chest: Effort normal and breath sounds normal. No respiratory distress. Abdominal: Soft. Bowel sounds are normal, no distension. There is no tenderness. no masses Breast: no lumps or masses, no nipple discharge or rashes FEMALE GENITALIA:  Not done  RECTAL: not done  Musculoskeletal: Normal range of motion, no joint effusions. No gross deformities Neurological: he is alert and oriented to person, place, and time. No cranial nerve deficit. Coordination, balance, strength, speech and gait are normal.  Skin: Skin is warm and dry. No rash noted. No erythema.  Psychiatric: Patient has a normal mood and affect. behavior is normal. Judgment and thought content normal.    Fall Risk:    07/26/2022    8:11 AM 09/24/2021    3:52 PM 07/20/2021    8:15 AM 09/13/2020    2:16 PM 07/10/2020    3:56 PM  [place] in the past year? 0 0 0 0 0  Number falls in past yr: 0 0 0 0 0  Injury with Fall? 0 0 0 0 0  Risk for fall due to : No Fall Risks No Fall Risks     Follow up Falls prevention discussed;Education provided;Falls evaluation completed Falls prevention discussed Falls evaluation completed  Falls evaluation completed     Functional Status Survey: Is the patient deaf or have difficulty hearing?: No Does the patient have difficulty seeing, even when wearing glasses/contacts?: No Does the patient have difficulty concentrating, remembering, or making decisions?: No Does the patient have difficulty walking or climbing stairs?: No Does the patient have difficulty dressing or bathing?: No Does the patient have difficulty doing errands alone such as visiting a doctor's office or shopping?: No   Assessment & Plan   1.  Well adult exam  Discussed heathy diet, avoid tobacco and alcohol, continue pre natal vitamin   2. Need for hepatitis C screening test  She  wants to hold off until seen by OB  3. Screen for STD (sexually transmitted disease)  - Cervicovaginal ancillary only  4. [redacted] weeks gestation of pregnancy  LMP 10/13 GA [redacted] weeks EDD 01/31/2023   -USPSTF grade A and B recommendations reviewed with patient; age-appropriate recommendations, preventive care, screening tests, etc discussed and encouraged; healthy living encouraged; see AVS for patient education given to patient -Discussed importance of 150 minutes of physical activity weekly, eat two servings of fish weekly, eat one serving of tree nuts ( cashews, pistachios, pecans, almonds.Marland Kitchen) every other day, eat 6 servings of fruit/vegetables daily and drink plenty of water and avoid sweet beverages.   -Reviewed Health Maintenance: Yes.

## 2022-07-25 NOTE — Patient Instructions (Signed)
Preventive Care 21-21 Years Old, Female Preventive care refers to lifestyle choices and visits with your health care provider that can promote health and wellness. At this stage in your life, you may start seeing a primary care physician instead of a pediatrician for your preventive care. Preventive care visits are also called wellness exams. What can I expect for my preventive care visit? Counseling During your preventive care visit, your health care provider may ask about your: Medical history, including: Past medical problems. Family medical history. Pregnancy history. Current health, including: Menstrual cycle. Method of birth control. Emotional well-being. Home life and relationship well-being. Sexual activity and sexual health. Lifestyle, including: Alcohol, nicotine or tobacco, and drug use. Access to firearms. Diet, exercise, and sleep habits. Sunscreen use. Motor vehicle safety. Physical exam Your health care provider may check your: Height and weight. These may be used to calculate your BMI (body mass index). BMI is a measurement that tells if you are at a healthy weight. Waist circumference. This measures the distance around your waistline. This measurement also tells if you are at a healthy weight and may help predict your risk of certain diseases, such as type 2 diabetes and high blood pressure. Heart rate and blood pressure. Body temperature. Skin for abnormal spots. Breasts. What immunizations do I need?  Vaccines are usually given at various ages, according to a schedule. Your health care provider will recommend vaccines for you based on your age, medical history, and lifestyle or other factors, such as travel or where you work. What tests do I need? Screening Your health care provider may recommend screening tests for certain conditions. This may include: Vision and hearing tests. Lipid and cholesterol levels. Pelvic exam and Pap test. Hepatitis B  test. Hepatitis C test. HIV (human immunodeficiency virus) test. STI (sexually transmitted infection) testing, if you are at risk. Tuberculosis skin test if you have symptoms. BRCA-related cancer screening. This may be done if you have a family history of breast, ovarian, tubal, or peritoneal cancers. Talk with your health care provider about your test results, treatment options, and if necessary, the need for more tests. Follow these instructions at home: Eating and drinking  Eat a healthy diet that includes fresh fruits and vegetables, whole grains, lean protein, and low-fat dairy products. Drink enough fluid to keep your urine pale yellow. Do not drink alcohol if: Your health care provider tells you not to drink. You are pregnant, may be pregnant, or are planning to become pregnant. You are under the legal drinking age. In the [place] the legal drinking age is 21. If you drink alcohol: Limit how much you have to 0-1 drink a day. Know how much alcohol is in your drink. In the [place] one drink equals one 12 oz bottle of beer (355 mL), one 5 oz glass of wine (148 mL), or one 1 oz glass of hard liquor (44 mL). Lifestyle Brush your teeth every morning and night with fluoride toothpaste. Floss one time each day. Exercise for at least 30 minutes 5 or more days of the week. Do not use any products that contain nicotine or tobacco. These products include cigarettes, chewing tobacco, and vaping devices, such as e-cigarettes. If you need help quitting, ask your health care provider. Do not use drugs. If you are sexually active, practice safe sex. Use a condom or other form of protection to prevent STIs. If you do not wish to become pregnant, use a form of birth control. If you plan to become pregnant,  see your health care provider for a prepregnancy visit. Find healthy ways to manage stress, such as: Meditation, yoga, or listening to music. Journaling. Talking to a trusted person. Spending time  with friends and family. Safety Always wear your seat belt while driving or riding in a vehicle. Do not drive: If you have been drinking alcohol. Do not ride with someone who has been drinking. When you are tired or distracted. While texting. If you have been using any mind-altering substances or drugs. Wear a helmet and other protective equipment during sports activities. If you have firearms in your house, make sure you follow all gun safety procedures. Seek help if you have been bullied, physically abused, or sexually abused. Use the internet responsibly to avoid dangers, such as online bullying and online sex predators. What's next? Go to your health care provider once a year for an annual wellness visit. Ask your health care provider how often you should have your eyes and teeth checked. Stay up to date on all vaccines. This information is not intended to replace advice given to you by your health care provider. Make sure you discuss any questions you have with your health care provider. Document Revised: 12/27/2020 Document Reviewed: 12/27/2020 Elsevier Patient Education  2023 Elsevier Inc.  

## 2022-07-26 ENCOUNTER — Encounter: Payer: Self-pay | Admitting: Family Medicine

## 2022-07-26 ENCOUNTER — Ambulatory Visit (INDEPENDENT_AMBULATORY_CARE_PROVIDER_SITE_OTHER): Payer: Commercial Managed Care - PPO | Admitting: Family Medicine

## 2022-07-26 ENCOUNTER — Other Ambulatory Visit (HOSPITAL_COMMUNITY)
Admission: RE | Admit: 2022-07-26 | Discharge: 2022-07-26 | Disposition: A | Discharge status: Home / Self Care | Payer: Commercial Managed Care - PPO | Source: Ambulatory Visit | Attending: Family Medicine | Admitting: Family Medicine

## 2022-07-26 VITALS — BP 116/70 | HR 106 | Temp 98.6°F | Resp 16 | Ht 65.0 in | Wt 107.4 lb

## 2022-07-26 DIAGNOSIS — Z3A13 13 weeks gestation of pregnancy: Secondary | ICD-10-CM | POA: Diagnosis not present

## 2022-07-26 DIAGNOSIS — Z0000 Encounter for general adult medical examination without abnormal findings: Secondary | ICD-10-CM

## 2022-07-26 DIAGNOSIS — Z113 Encounter for screening for infections with a predominantly sexual mode of transmission: Secondary | ICD-10-CM

## 2022-07-26 DIAGNOSIS — Z1159 Encounter for screening for other viral diseases: Secondary | ICD-10-CM | POA: Diagnosis not present

## 2022-07-26 LAB — POCT URINE PREGNANCY: Preg Test, Ur: POSITIVE — AB

## 2022-07-28 LAB — CERVICOVAGINAL ANCILLARY ONLY
Chlamydia: NEGATIVE
Comment: NEGATIVE
Comment: NEGATIVE
Comment: NORMAL
Neisseria Gonorrhea: NEGATIVE
Trichomonas: POSITIVE — AB

## 2022-07-29 ENCOUNTER — Other Ambulatory Visit: Payer: Self-pay | Admitting: Family Medicine

## 2022-07-29 ENCOUNTER — Ambulatory Visit: Payer: Self-pay

## 2022-07-29 DIAGNOSIS — A5901 Trichomonal vulvovaginitis: Secondary | ICD-10-CM

## 2022-07-29 MED ORDER — METRONIDAZOLE 500 MG PO TABS: 500.0000 mg | ORAL_TABLET | Freq: Two times a day (BID) | ORAL | 0 refills | Status: AC

## 2022-07-29 NOTE — Telephone Encounter (Signed)
Message from Luciana Axe sent at 07/29/2022 10:00 AM EST  Summary: metroNIDAZOLE (FLAGYL) 500 MG tablet [[id number]] Advice   Pt is calling to ask is it save to take metroNIDAZOLE (FLAGYL) 500 MG tablet [[id number]] while she is [redacted] weeks pregnant? Please advise        Attempted to call pt back, but unable to LM- VM full.

## 2022-07-29 NOTE — Telephone Encounter (Addendum)
Pt returned our call.  Shared note from office.   Royal Hawthorn, CMA  to Steele Sizer, MD     07/29/22 10:49 AM Spoke with patient and told her if was safe to take per our research this morning, voicemail full and unable to leave a message. Will try again.   No further questions.   Summary: metroNIDAZOLE (FLAGYL) 500 MG tablet [[id number]] Advice   Pt is calling to ask is it save to take metroNIDAZOLE (FLAGYL) 500 MG tablet [[id number]] while she is [redacted] weeks pregnant? Please advise     Reason for Disposition  [1] Caller has NON-URGENT medicine question about med that PCP prescribed AND [2] triager unable to answer question  Answer Assessment - Initial Assessment Questions 1. NAME of MEDICINE: "What medicine(s) are you calling about?"     Metronidazole 2. QUESTION: "What is your question?" (e.g., double dose of medicine, side effect)     Should this be taken during pregnancy 3. PRESCRIBER: "Who prescribed the medicine?" Reason: if prescribed by specialist, call should be referred to that group.      4. SYMPTOMS: "Do you have any symptoms?" If Yes, ask: "What symptoms are you having?"  "How bad are the symptoms (e.g., mild, moderate, severe)      5. PREGNANCY:  "Is there any chance that you are pregnant?" "When was your last menstrual period?"  Protocols used: Medication Question Call-A-AH

## 2022-08-12 DIAGNOSIS — O99331 Smoking (tobacco) complicating pregnancy, first trimester: Secondary | ICD-10-CM | POA: Diagnosis not present

## 2022-08-12 DIAGNOSIS — R1030 Lower abdominal pain, unspecified: Secondary | ICD-10-CM | POA: Diagnosis not present

## 2022-08-12 DIAGNOSIS — O26891 Other specified pregnancy related conditions, first trimester: Secondary | ICD-10-CM | POA: Diagnosis not present

## 2022-08-12 DIAGNOSIS — R8271 Bacteriuria: Secondary | ICD-10-CM | POA: Diagnosis not present

## 2022-08-12 DIAGNOSIS — Z3A11 11 weeks gestation of pregnancy: Secondary | ICD-10-CM | POA: Diagnosis not present

## 2022-08-12 DIAGNOSIS — O2341 Unspecified infection of urinary tract in pregnancy, first trimester: Secondary | ICD-10-CM | POA: Diagnosis not present

## 2022-08-27 DIAGNOSIS — Z3401 Encounter for supervision of normal first pregnancy, first trimester: Secondary | ICD-10-CM | POA: Diagnosis not present

## 2022-08-28 ENCOUNTER — Ambulatory Visit (INDEPENDENT_AMBULATORY_CARE_PROVIDER_SITE_OTHER): Payer: Commercial Managed Care - PPO | Admitting: Nurse Practitioner

## 2022-08-28 ENCOUNTER — Encounter: Payer: Self-pay | Admitting: Nurse Practitioner

## 2022-08-28 ENCOUNTER — Other Ambulatory Visit: Payer: Self-pay

## 2022-08-28 VITALS — BP 120/68 | HR 110 | Temp 97.9°F | Resp 18 | Ht 65.0 in | Wt 113.8 lb

## 2022-08-28 DIAGNOSIS — Z331 Pregnant state, incidental: Secondary | ICD-10-CM | POA: Diagnosis not present

## 2022-08-28 DIAGNOSIS — J069 Acute upper respiratory infection, unspecified: Secondary | ICD-10-CM | POA: Diagnosis not present

## 2022-08-28 LAB — POCT INFLUENZA A/B
Influenza A, POC: NEGATIVE
Influenza B, POC: NEGATIVE

## 2022-08-28 NOTE — Progress Notes (Addendum)
   BP 120/68   Pulse (!) 110   Temp 97.9 F (36.6 C) (Oral)   Resp 18   Ht '5\' 5"'$  (1.651 m)   Wt 113 lb 12.8 oz (51.6 kg)   LMP 05/26/2022   SpO2 98%   BMI 18.94 kg/m    Subjective:    Patient ID: Melissa Hensley, female    DOB: 2001/03/28, 22 y.o.   MRN: [id number]  HPI: Melissa Hensley is a 22 y.o. female  Chief Complaint  Patient presents with   URI    Cough, congested, headache for 3 days   URI: patient is around [redacted] weeks pregnant and reports uri symptoms. Her symptoms include nasal congestion, sore throat, headache and cough.  She denies any fever, or shortness of breath.  Symptoms started Monday night.  She says she has not taken any medication.  Will get flu and covid testing done.  Flu negative, covid pending.  Recommend taking zyrtec, flonase and delsym.  She can also use hot tea, honey, gargle with salt water.  Using a humidifier can also help.    Relevant past medical, surgical, family and social history reviewed and updated as indicated. Interim medical history since our last visit reviewed. Allergies and medications reviewed and updated.  Review of Systems  Constitutional: Negative for fever or weight change.  HEENT: positive for nasal congestion, sore throat Respiratory: positive for cough and negative for shortness of breath.   Cardiovascular: Negative for chest pain or palpitations.  Gastrointestinal: Negative for abdominal pain, no bowel changes.  Musculoskeletal: Negative for gait problem or joint swelling.  Skin: Negative for rash.  Neurological: Negative for dizziness , positive for  headache.  No other specific complaints in a complete review of systems (except as listed in HPI above).      Objective:    BP 120/68   Pulse (!) 110   Temp 97.9 F (36.6 C) (Oral)   Resp 18   Ht '5\' 5"'$  (1.651 m)   Wt 113 lb 12.8 oz (51.6 kg)   LMP 05/26/2022   SpO2 98%   BMI 18.94 kg/m   Wt Readings from Last 3 Encounters:  08/28/22 113 lb 12.8 oz (51.6 kg)   07/26/22 107 lb 6.4 oz (48.7 kg)  10/05/21 100 lb 6.4 oz (45.5 kg) (4 %, Z= -1.77)*   * Growth percentiles are based on CDC (Girls, 2-20 Years) data.    Physical Exam  Constitutional: Patient appears well-developed and well-nourished. No distress.  HEENT: head atraumatic, normocephalic, pupils equal and reactive to light, ears Tms clear, neck supple, throat within normal limits, no lymphadenopathy Cardiovascular: tachycardic rate, regular rhythm and normal heart sounds.  No murmur heard. No BLE edema. Pulmonary/Chest: Effort normal and breath sounds normal. No respiratory distress. Abdominal: Soft.  There is no tenderness. Psychiatric: Patient has a normal mood and affect. behavior is normal. Judgment and thought content normal.       Assessment & Plan:   Problem List Items Addressed This Visit   None Visit Diagnoses     Viral upper respiratory tract infection    -  Primary   Recommend taking zyrtec, flonase and delsym.  She can also use hot tea, honey, gargle with salt water.  Using a humidifier can also help.   Relevant Orders   POCT Influenza A/B (Completed)   Novel Coronavirus, NAA ([facility])        Follow up plan: Return if symptoms worsen or fail to improve.

## 2022-08-29 LAB — NOVEL CORONAVIRUS, NAA: SARS-CoV-2, NAA: NOT DETECTED

## 2022-09-02 LAB — PANORAMA PRENATAL TEST FULL PANEL:PANORAMA TEST PLUS 5 ADDITIONAL MICRODELETIONS: FETAL FRACTION: 9.9

## 2022-10-07 DIAGNOSIS — Z3689 Encounter for other specified antenatal screening: Secondary | ICD-10-CM | POA: Diagnosis not present

## 2022-10-07 DIAGNOSIS — Z3A19 19 weeks gestation of pregnancy: Secondary | ICD-10-CM | POA: Diagnosis not present

## 2022-11-07 DIAGNOSIS — Z3A23 23 weeks gestation of pregnancy: Secondary | ICD-10-CM | POA: Diagnosis not present

## 2022-11-07 DIAGNOSIS — Z362 Encounter for other antenatal screening follow-up: Secondary | ICD-10-CM | POA: Diagnosis not present

## 2022-12-12 DIAGNOSIS — Z3400 Encounter for supervision of normal first pregnancy, unspecified trimester: Secondary | ICD-10-CM | POA: Diagnosis not present

## 2022-12-12 DIAGNOSIS — O23593 Infection of other part of genital tract in pregnancy, third trimester: Secondary | ICD-10-CM | POA: Diagnosis not present

## 2022-12-12 DIAGNOSIS — Z8619 Personal history of other infectious and parasitic diseases: Secondary | ICD-10-CM | POA: Diagnosis not present

## 2022-12-12 DIAGNOSIS — Z23 Encounter for immunization: Secondary | ICD-10-CM | POA: Diagnosis not present

## 2023-02-24 DIAGNOSIS — Z3A39 39 weeks gestation of pregnancy: Secondary | ICD-10-CM | POA: Diagnosis not present

## 2023-02-24 DIAGNOSIS — U071 COVID-19: Secondary | ICD-10-CM | POA: Diagnosis not present

## 2023-02-24 DIAGNOSIS — O368131 Decreased fetal movements, third trimester, fetus 1: Secondary | ICD-10-CM | POA: Diagnosis not present

## 2023-02-24 DIAGNOSIS — O98513 Other viral diseases complicating pregnancy, third trimester: Secondary | ICD-10-CM | POA: Diagnosis not present

## 2023-03-06 DIAGNOSIS — Z3A40 40 weeks gestation of pregnancy: Secondary | ICD-10-CM | POA: Diagnosis not present

## 2023-03-06 DIAGNOSIS — Z3493 Encounter for supervision of normal pregnancy, unspecified, third trimester: Secondary | ICD-10-CM | POA: Diagnosis not present

## 2023-03-12 DIAGNOSIS — O9962 Diseases of the digestive system complicating childbirth: Secondary | ICD-10-CM | POA: Diagnosis not present

## 2023-03-12 DIAGNOSIS — Z3A41 41 weeks gestation of pregnancy: Secondary | ICD-10-CM | POA: Diagnosis not present

## 2023-03-12 DIAGNOSIS — K219 Gastro-esophageal reflux disease without esophagitis: Secondary | ICD-10-CM | POA: Diagnosis not present

## 2023-03-12 DIAGNOSIS — Z6710 Type A blood, Rh positive: Secondary | ICD-10-CM | POA: Diagnosis not present

## 2023-03-12 DIAGNOSIS — O480 Post-term pregnancy: Secondary | ICD-10-CM | POA: Diagnosis not present

## 2023-03-12 DIAGNOSIS — Z87891 Personal history of nicotine dependence: Secondary | ICD-10-CM | POA: Diagnosis not present

## 2023-03-12 DIAGNOSIS — O134 Gestational [pregnancy-induced] hypertension without significant proteinuria, complicating childbirth: Secondary | ICD-10-CM | POA: Diagnosis not present

## 2023-03-13 DIAGNOSIS — Z3A41 41 weeks gestation of pregnancy: Secondary | ICD-10-CM | POA: Diagnosis not present

## 2023-03-13 DIAGNOSIS — O133 Gestational [pregnancy-induced] hypertension without significant proteinuria, third trimester: Secondary | ICD-10-CM | POA: Insufficient documentation

## 2023-04-21 DIAGNOSIS — O135 Gestational [pregnancy-induced] hypertension without significant proteinuria, complicating the puerperium: Secondary | ICD-10-CM | POA: Diagnosis not present

## 2023-05-01 DIAGNOSIS — R87610 Atypical squamous cells of undetermined significance on cytologic smear of cervix (ASC-US): Secondary | ICD-10-CM | POA: Insufficient documentation

## 2023-07-29 ENCOUNTER — Ambulatory Visit (INDEPENDENT_AMBULATORY_CARE_PROVIDER_SITE_OTHER): Payer: Commercial Managed Care - PPO | Admitting: Family Medicine

## 2023-07-29 ENCOUNTER — Encounter: Payer: Self-pay | Admitting: Family Medicine

## 2023-07-29 ENCOUNTER — Other Ambulatory Visit (HOSPITAL_COMMUNITY)
Admission: RE | Admit: 2023-07-29 | Discharge: 2023-07-29 | Disposition: A | Discharge status: Home / Self Care | Payer: Commercial Managed Care - PPO | Source: Ambulatory Visit | Attending: Family Medicine | Admitting: Family Medicine

## 2023-07-29 VITALS — BP 114/72 | HR 97 | Temp 97.7°F | Resp 16 | Ht 64.75 in | Wt 125.3 lb

## 2023-07-29 DIAGNOSIS — Z130 Encounter for screening for diseases of the blood and blood-forming organs and certain disorders involving the immune mechanism: Secondary | ICD-10-CM

## 2023-07-29 DIAGNOSIS — R8290 Unspecified abnormal findings in urine: Secondary | ICD-10-CM

## 2023-07-29 DIAGNOSIS — Z113 Encounter for screening for infections with a predominantly sexual mode of transmission: Secondary | ICD-10-CM

## 2023-07-29 DIAGNOSIS — Z0000 Encounter for general adult medical examination without abnormal findings: Secondary | ICD-10-CM | POA: Insufficient documentation

## 2023-07-29 DIAGNOSIS — R770 Abnormality of albumin: Secondary | ICD-10-CM | POA: Diagnosis not present

## 2023-07-29 NOTE — Progress Notes (Signed)
 Name: Melissa Hensley   MRN: [id number]    DOB: Jun 23, 2001   Date:07/29/2023       Progress Note  Subjective  Chief Complaint  Chief Complaint  Patient presents with   Annual Exam    HPI  Patient presents for annual CPE.  Diet: eat more , states no longer a picky eater, breast feeding and will resume MVI. Albumin was low during delivery  Exercise: not currently  Last Eye Exam: not applicable Last Dental Exam:  advised to schedule   Flowsheet Row Office Visit from 07/29/2023 in [facility]  AUDIT-C Score 1      Depression: Phq 9 is  negative    07/29/2023    8:26 AM 08/28/2022   10:03 AM 07/26/2022    8:12 AM 10/05/2021    3:32 PM 09/24/2021    3:52 PM  Depression screen PHQ 2/9  Decreased Interest 0 0 0 0 0  Down, Depressed, Hopeless 0 0 0 0 0  PHQ - 2 Score 0 0 0 0 0  Altered sleeping 0  0  0  Tired, decreased energy 0  0  0  Change in appetite 0  0  0  Feeling bad or failure about yourself  0  0  0  Trouble concentrating 0  0  0  Moving slowly or fidgety/restless 0  0  0  Suicidal thoughts 0  0  0  PHQ-9 Score 0  0  0  Difficult doing work/chores Not difficult at all  Not difficult at all     Hypertension: BP Readings from Last 3 Encounters:  07/29/23 114/72  08/28/22 120/68  07/26/22 116/70   Obesity: Wt Readings from Last 3 Encounters:  07/29/23 125 lb 4.8 oz (56.8 kg)  08/28/22 113 lb 12.8 oz (51.6 kg)  07/26/22 107 lb 6.4 oz (48.7 kg)   BMI Readings from Last 3 Encounters:  07/29/23 21.01 kg/m  08/28/22 18.94 kg/m  07/26/22 17.87 kg/m     Vaccines:  RSV: not applicable HPV: unknown  Tdap: completed Shingrix: not applicable Pneumonia: not applicable Flu: declined COVID-19: declined  Hep C Screening: completed STD testing and prevention (HIV/chl/gon/syphilis): today  Intimate partner violence: negative screen  Sexual History : new sexual partner for the past two months Menstrual History/LMP/Abnormal Bleeding: first  cycle since delivery in 02/2023 - not heavy - only on condoms, does not want to start ocp Discussed importance of follow up if any post-menopausal bleeding: not applicable  Incontinence Symptoms: negative for symptoms   Breast cancer:  - Last Mammogram: N/A - BRCA gene screening: N/A  Osteoporosis Prevention : Discussed high calcium and vitamin D  supplementation, weight bearing exercises Bone density :not applicable   Cervical cancer screening: up-to-date repeat in 2027   Skin cancer: Discussed monitoring for atypical lesions  Colorectal cancer: N/A   Lung cancer:  Low Dose CT Chest recommended if Age 70-80 years, 20 pack-year currently smoking OR have quit w/in 15years. Patient does not qualify for screen     Advanced Care Planning: A voluntary discussion about advance care planning including the explanation and discussion of advance directives.  Discussed health care proxy and Living will, and the patient was able to identify a health care proxy as mother .  Patient does not have a living will and power of attorney of health care   Patient Active Problem List   Diagnosis Date Noted   ASCUS of cervix with negative high risk HPV 05/01/2023   Gestational  hypertension without significant proteinuria in third trimester 03/13/2023   Vapes nicotine containing substance 11/24/2020   Plantar fasciitis 09/13/2020   Moderate episode of recurrent major depressive disorder (HCC) 08/26/2018   Grieving 08/26/2018   Problem with child being bullied 08/26/2018   Picky eater 04/23/2016   Vitamin D  deficiency 04/23/2016    History reviewed. No pertinent surgical history.  Family History  Problem Relation Age of Onset   Diabetes Paternal Uncle     Social History   Socioeconomic History   Marital status: Single    Spouse name: Not on file   Number of children: 0   Years of education: Not on file   Highest education level: Not on file  Occupational History   Occupation: student      Employer: [employer]  Tobacco Use   Smoking status: Former    Types: E-cigarettes    Quit date: 06/25/2022    Years since quitting: 1.0   Smokeless tobacco: Never  Vaping Use   Vaping status: Former   Substances: Nicotine, Flavoring  Substance and Sexual Activity   Alcohol use: Yes    Comment: occasion   Drug use: Never   Sexual activity: Yes    Partners: Male    Birth control/protection: None  Other Topics Concern   Not on file  Social History Narrative   Parents divorced    Moved back with her mother and one brother    Social Drivers of Corporate Investment Banker Strain: Low Risk  (07/29/2023)   Overall Financial Resource Strain (CARDIA)    Difficulty of Paying Living Expenses: Not hard at all  Food Insecurity: No Food Insecurity (07/29/2023)   Hunger Vital Sign    Worried About Running Out of Food in the Last Year: Never true    Ran Out of Food in the Last Year: Never true  Transportation Needs: No Transportation Needs (07/29/2023)   PRAPARE - Administrator, Civil Service (Medical): No    Lack of Transportation (Non-Medical): No  Physical Activity: Inactive (07/29/2023)   Exercise Vital Sign    Days of Exercise per Week: 0 days    Minutes of Exercise per Session: 0 min  Stress: Stress Concern Present (07/29/2023)   [employer] of Occupational Health - Occupational Stress Questionnaire    Feeling of Stress : To some extent  Social Connections: Socially Isolated (07/29/2023)   Social Connection and Isolation Panel [NHANES]    Frequency of Communication with Friends and Family: More than three times a week    Frequency of Social Gatherings with Friends and Family: More than three times a week    Attends Religious Services: Never    Database Administrator or Organizations: No    Attends Banker Meetings: Never    Marital Status: Never married  Intimate Partner Violence: Not At Risk (07/29/2023)   Humiliation, Afraid, Rape, and Kick  questionnaire    Fear of Current or Ex-Partner: No    Emotionally Abused: No    Physically Abused: No    Sexually Abused: No    No current outpatient medications on file.  Allergies  Allergen Reactions   Red Dye #40 (Allura Red) Diarrhea     ROS  Constitutional: Negative for fever , positive for weight change.  Respiratory: Negative for cough and shortness of breath.   Cardiovascular: Negative for chest pain or palpitations.  Gastrointestinal: Negative for abdominal pain, no bowel changes.  Musculoskeletal: Negative for gait problem or  joint swelling.  Skin: Negative for rash.  Neurological: Negative for dizziness or headache.  No other specific complaints in a complete review of systems (except as listed in HPI above).   Objective  Vitals:   07/29/23 0834  BP: 114/72  Pulse: 97  Resp: 16  Temp: 97.7 F (36.5 C)  TempSrc: Oral  SpO2: 96%  Weight: 125 lb 4.8 oz (56.8 kg)  Height: 5' 4.75 (1.645 m)    Body mass index is 21.01 kg/m.  Physical Exam  Constitutional: Patient appears well-developed and well-nourished. No distress.  HENT: Head: Normocephalic and atraumatic. Ears: B TMs ok, no erythema or effusion; Nose: Nose normal. Mouth/Throat: Oropharynx is clear and moist. No oropharyngeal exudate.  Eyes: Conjunctivae and EOM are normal. Pupils are equal, round, and reactive to light. No scleral icterus.  Neck: Normal range of motion. Neck supple. No JVD present. No thyromegaly present.  Cardiovascular: Normal rate, regular rhythm and normal heart sounds.  No murmur heard. No BLE edema. Pulmonary/Chest: Effort normal and breath sounds normal. No respiratory distress. Abdominal: Soft. Bowel sounds are normal, no distension. There is no tenderness. no masses Breast:lumpy ,no nipple discharge or rashes FEMALE GENITALIA:  Not done  RECTAL: not done  Musculoskeletal: Normal range of motion, no joint effusions. No gross deformities Neurological: he is alert and  oriented to person, place, and time. No cranial nerve deficit. Coordination, balance, strength, speech and gait are normal.  Skin: Skin is warm and dry. No rash noted. No erythema.  Psychiatric: Patient has a normal mood and affect. behavior is normal. Judgment and thought content normal.     Assessment & Plan  1. Well adult exam (Primary)  - RPR - HIV Antibody (routine testing w rflx) - Cervicovaginal ancillary only - CBC with Differential/Platelet - COMPLETE METABOLIC PANEL WITH GFR  2. Screening examination for STI  - RPR - HIV Antibody (routine testing w rflx) - Cervicovaginal ancillary only  3. Low serum albumin  - COMPLETE METABOLIC PANEL WITH GFR  4. Screening for deficiency anemia  - CBC with Differential/Platelet  5. Abnormal urine odor  - CULTURE, URINE COMPREHENSIVE   -USPSTF grade A and B recommendations reviewed with patient; age-appropriate recommendations, preventive care, screening tests, etc discussed and encouraged; healthy living encouraged; see AVS for patient education given to patient -Discussed importance of 150 minutes of physical activity weekly, eat two servings of fish weekly, eat one serving of tree nuts ( cashews, pistachios, pecans, almonds.SABRA) every other day, eat 6 servings of fruit/vegetables daily and drink plenty of water and avoid sweet beverages.   -Reviewed Health Maintenance: Yes.

## 2023-07-30 LAB — CERVICOVAGINAL ANCILLARY ONLY
Bacterial Vaginitis (gardnerella): NEGATIVE
Chlamydia: NEGATIVE
Comment: NEGATIVE
Comment: NEGATIVE
Comment: NEGATIVE
Comment: NORMAL
Neisseria Gonorrhea: NEGATIVE
Trichomonas: NEGATIVE

## 2023-07-31 LAB — COMPLETE METABOLIC PANEL WITH GFR
AG Ratio: 1.7 (calc) (ref 1.0–2.5)
ALT: 14 U/L (ref 6–29)
AST: 14 U/L (ref 10–30)
Albumin: 4.6 g/dL (ref 3.6–5.1)
Alkaline phosphatase (APISO): 137 U/L — ABNORMAL HIGH (ref 31–125)
BUN: 13 mg/dL (ref 7–25)
CO2: 25 mmol/L (ref 20–32)
Calcium: 9.9 mg/dL (ref 8.6–10.2)
Chloride: 109 mmol/L (ref 98–110)
Creat: 0.71 mg/dL (ref 0.50–0.96)
Globulin: 2.7 g/dL (ref 1.9–3.7)
Glucose, Bld: 92 mg/dL (ref 65–99)
Potassium: 4.4 mmol/L (ref 3.5–5.3)
Sodium: 139 mmol/L (ref 135–146)
Total Bilirubin: 0.5 mg/dL (ref 0.2–1.2)
Total Protein: 7.3 g/dL (ref 6.1–8.1)
eGFR: 124 mL/min/{1.73_m2} (ref >=60)

## 2023-07-31 LAB — CULTURE, URINE COMPREHENSIVE
MICRO NUMBER:: 15957810
SPECIMEN QUALITY:: ADEQUATE

## 2023-07-31 LAB — CBC WITH DIFFERENTIAL/PLATELET
Absolute Lymphocytes: 1434 {cells}/uL (ref 850–3900)
Absolute Monocytes: 442 {cells}/uL (ref 200–950)
Basophils Absolute: 28 {cells}/uL (ref 0–200)
Basophils Relative: 0.6 %
Eosinophils Absolute: 52 {cells}/uL (ref 15–500)
Eosinophils Relative: 1.1 %
HCT: 44 % (ref 35.0–45.0)
Hemoglobin: 14.6 g/dL (ref 11.7–15.5)
MCH: 28.4 pg (ref 27.0–33.0)
MCHC: 33.2 g/dL (ref 32.0–36.0)
MCV: 85.6 fL (ref 80.0–100.0)
MPV: 9.9 fL (ref 7.5–12.5)
Monocytes Relative: 9.4 %
Neutro Abs: 2745 {cells}/uL (ref 1500–7800)
Neutrophils Relative %: 58.4 %
Platelets: 331 10*3/uL (ref 140–400)
RBC: 5.14 10*6/uL — ABNORMAL HIGH (ref 3.80–5.10)
RDW: 11.8 % (ref 11.0–15.0)
Total Lymphocyte: 30.5 %
WBC: 4.7 10*3/uL (ref 3.8–10.8)

## 2023-07-31 LAB — HIV ANTIBODY (ROUTINE TESTING W REFLEX): HIV 1&2 Ab, 4th Generation: NONREACTIVE

## 2023-07-31 LAB — RPR: RPR Ser Ql: NONREACTIVE

## 2023-08-01 ENCOUNTER — Ambulatory Visit: Payer: Commercial Managed Care - PPO | Admitting: Physician Assistant

## 2023-08-01 ENCOUNTER — Other Ambulatory Visit: Payer: Self-pay | Admitting: Family Medicine

## 2023-08-01 ENCOUNTER — Encounter: Payer: Self-pay | Admitting: Family Medicine

## 2023-08-01 DIAGNOSIS — N3000 Acute cystitis without hematuria: Secondary | ICD-10-CM

## 2023-08-01 MED ORDER — SULFAMETHOXAZOLE-TRIMETHOPRIM 400-80 MG PO TABS: 1.0000 | ORAL_TABLET | Freq: Two times a day (BID) | ORAL | 0 refills | Status: DC

## 2023-08-15 ENCOUNTER — Ambulatory Visit: Payer: Commercial Managed Care - PPO | Admitting: Family Medicine

## 2023-10-03 ENCOUNTER — Telehealth: Admitting: Physician Assistant

## 2023-10-03 DIAGNOSIS — L301 Dyshidrosis [pompholyx]: Secondary | ICD-10-CM

## 2023-10-03 MED ORDER — TRIAMCINOLONE ACETONIDE 0.1 % EX CREA: 1.0000 | TOPICAL_CREAM | Freq: Two times a day (BID) | CUTANEOUS | 0 refills | Status: DC

## 2023-10-03 NOTE — Patient Instructions (Signed)
 Melissa Hensley, thank you for joining [name], PA-C for today's virtual visit.  While this provider is not your primary care provider (PCP), if your PCP is located in our provider database this encounter information will be shared with them immediately following your visit.   A [facility] MyChart account gives you access to today's visit and all your visits, tests, and labs performed at [facility] " click here if you don't have a [facility] MyChart account or go to [email or web address]  Consent: (Patient) Melissa Hensley provided verbal consent for this virtual visit at the beginning of the encounter.  Current Medications:  Current Outpatient Medications:    triamcinolone cream (KENALOG) 0.1 %, Apply 1 Application topically 2 (two) times daily., Disp: 45 g, Rfl: 0   sulfamethoxazole-trimethoprim (BACTRIM) 400-80 MG tablet, Take 1 tablet by mouth 2 (two) times daily., Disp: 6 tablet, Rfl: 0   Medications ordered in this encounter:  Meds ordered this encounter  Medications   triamcinolone cream (KENALOG) 0.1 %    Sig: Apply 1 Application topically 2 (two) times daily.    Dispense:  45 g    Refill:  0    Supervising Provider:   Merrilee Jansky [[id number]]     *If you need refills on other medications prior to your next appointment, please contact your pharmacy*  Follow-Up: Call back or seek an in-person evaluation if the symptoms worsen or if the condition fails to improve as anticipated.  [facility] [phone]  Other Instructions Itchy Skin Blisters (Dyshidrotic Eczema): What to Know Dyshidrotic eczema, also called pompholyx, is a type of skin condition. It causes very itchy blisters on the hands and feet. It's more common before age 23, but it can affect people of any age. There's no cure, but treatments can help relieve symptoms. What are the causes? The cause of this condition isn't known. What increases the risk? You're more  likely to get this condition if: You wash your hands a lot. You have a personal or family history of eczema, allergies, asthma, or hay fever. You have allergies to metals like nickel or cobalt. You work with skin irritants like detergents or cement. You smoke. You're often stressed. You have an immune system condition. What are the signs or symptoms? Symptoms may come and go and often affect your hands or feet. Symptoms include: Severe itching, often before blisters show up. Blisters that can form suddenly. At first, the blisters may form near the fingertips. In severe cases, blisters may grow to large blister masses. Blisters go away in 2-3 weeks. This is followed by a less itchy and dry phase. Pain and swelling. Cracks or long, narrow openings (fissures) in the skin. Severe dryness. Ridges on the nails. How is this diagnosed? This condition may be diagnosed based on: Symptoms. Physical exam. Medical history. Skin scrapings to rule out fungal infections. Testing a swab of fluid for bacteria. A biopsy. A small piece of skin is removed and checked for infection or to rule out other conditions. Skin patch tests that use allergen patches on your back to check for allergic reactions. You may need to see a skin specialist called a dermatologist. This specialist can help diagnose and treat this condition. How is this treated? There's no cure for this condition, but treatment can help relieve symptoms. Your health care provider may suggest: Avoiding allergens, irritants, or triggers that make your symptoms worse. You may need to: Use different soaps or lotions. Avoid hot weather  or places where you'll sweat a lot. Learn stress management techniques, like relaxation and exercise. Follow diet changes that your provider recommends. Soothing your skin by: Using a clean, damp towel on the affected area. Taking baths with a special salt called aluminum acetate. Medicines such as: Medicine  to lessen itching (antihistamines). Medicine to put on your skin to lessen swelling and irritation (corticosteroid creams or ointments). Immunosuppressant medicines. These are prescribed in severe cases. Antibiotic medicine if there's a skin infection. Light therapy, also called phototherapy. This is where you put your affected skin under ultraviolet (UV) light to lessen itchiness and inflammation. Follow these instructions at home: Bathing and skin care  Wash your skin gently. After bathing or washing your hands, pat your skin dry. Avoid rubbing. Take off your jewelry before bathing. If your skin under the jewelry stays wet, blisters may form or get worse. Apply cool compresses as told by your provider. To do this: Soak a clean towel in cool water. Squeeze out the water until the towel is damp. Place the towel on the affected skin for 20 minutes, 2-3 times a day. Let the water partially air dry, and then put on lotion. Use mild soaps, cleaners, and lotions that don't contain dyes, perfumes, or irritants. Keep your skin hydrated. To do this: Take warm baths or showers. Avoid hot water. Put on lotion within 3 minutes of bathing to lock in moisture. Medicines Take or apply your medicines only as told. If you were given antibiotics, take or apply them as told. Do not stop using them even if you start to feel better. General instructions Use skin creams or lotions as told. Avoid triggers and allergens that make your symptoms worse. Keep fingernails short to avoid scratching open the skin. Use waterproof gloves to protect your hands when doing work that keeps your hands wet for a long time. Limit how often you wear socks or shoes. If you have to wear socks, wear cotton socks that absorb moisture. Choose loose-fitting shoes. Do not smoke, vape, or use nicotine or tobacco. Keep all follow-up visits to make sure your treatment plan is working. Contact a health care provider if: You have  symptoms that don't go away. You have signs of infection, such as: Crusting, pus, or a bad smell. More redness, swelling, or pain. More warmth in the affected area. Your skin has red streaks that are painful. This information is not intended to replace advice given to you by your health care provider. Make sure you discuss any questions you have with your health care provider. Document Revised: 12/03/2022 Document Reviewed: 12/03/2022 Elsevier Patient Education  2024 Elsevier Inc.   If you have been instructed to have an in-person evaluation today at a local Urgent Care facility, please use the link below. It will take you to a list of all of our available [facility], including address, phone number and hours of operation. Please do not delay care.  [facility]  If you or a family member do not have a primary care provider, use the link below to schedule a visit and establish care. When you choose a [facility] primary care physician or advanced practice provider, you gain a long-term partner in health. Find a Primary Care Provider  Learn more about [facility]'s in-office and virtual care options: [facility] - Get Care Now

## 2023-10-03 NOTE — Progress Notes (Signed)
 Virtual Visit Consent   Melissa Hensley, you are scheduled for a virtual visit with a [facility] provider today. Just as with appointments in the office, your consent must be obtained to participate. Your consent will be active for this visit and any virtual visit you may have with one of our providers in the next 365 days. If you have a MyChart account, a copy of this consent can be sent to you electronically.  As this is a virtual visit, video technology does not allow for your provider to perform a traditional examination. This may limit your provider's ability to fully assess your condition. If your provider identifies any concerns that need to be evaluated in person or the need to arrange testing (such as labs, EKG, etc.), we will make arrangements to do so. Although advances in technology are sophisticated, we cannot ensure that it will always work on either your end or our end. If the connection with a video visit is poor, the visit may have to be switched to a telephone visit. With either a video or telephone visit, we are not always able to ensure that we have a secure connection.  By engaging in this virtual visit, you consent to the provision of healthcare and authorize for your insurance to be billed (if applicable) for the services provided during this visit. Depending on your insurance coverage, you may receive a charge related to this service.  I need to obtain your verbal consent now. Are you willing to proceed with your visit today? Melissa Hensley has provided verbal consent on 10/03/2023 for a virtual visit (video or telephone). [name], PA-C  Date: 10/03/2023 5:37 PM   Virtual Visit via Video Note   I, [name], connected with  Melissa Hensley  ([id number], [birth year]) on 10/03/23 at  5:30 PM EDT by a video-enabled telemedicine application and verified that I am speaking with the correct person using two identifiers.  Location: Patient: Virtual Visit Location  Patient: Home Provider: Virtual Visit Location Provider: Home Office   I discussed the limitations of evaluation and management by telemedicine and the availability of in person appointments. The patient expressed understanding and agreed to proceed.    History of Present Illness: Melissa Hensley is a 23 y.o. who identifies as a female who was assigned female at birth, and is being seen today for little bumps on pinky fingers only. Denies itching, burning, peeling. Just happened to notice them one day a few weeks ago and they have not improved.   Problems:  Patient Active Problem List   Diagnosis Date Noted   ASCUS of cervix with negative high risk HPV 05/01/2023   Gestational hypertension without significant proteinuria in third trimester 03/13/2023   Vapes nicotine containing substance 11/24/2020   Plantar fasciitis 09/13/2020   Moderate episode of recurrent major depressive disorder (HCC) 08/26/2018   Grieving 08/26/2018   Problem with child being bullied 08/26/2018   Picky eater 04/23/2016   Vitamin D deficiency 04/23/2016    Allergies:  Allergies  Allergen Reactions   Red Dye #40 (Allura Red) Diarrhea   Medications:  Current Outpatient Medications:    triamcinolone cream (KENALOG) 0.1 %, Apply 1 Application topically 2 (two) times daily., Disp: 45 g, Rfl: 0   sulfamethoxazole-trimethoprim (BACTRIM) 400-80 MG tablet, Take 1 tablet by mouth 2 (two) times daily., Disp: 6 tablet, Rfl: 0  Observations/Objective: Patient is well-developed, well-nourished in no acute distress.  Resting comfortably at home.  Head is normocephalic, atraumatic.  No  labored breathing.  Speech is clear and coherent with logical content.  Patient is alert and oriented at baseline.  Fine papular and vesicular lesions noted on the lateral pinky bilaterally; denies itching, peeling  Assessment and Plan: 1. Dyshidrotic eczema (Primary) - triamcinolone cream (KENALOG) 0.1 %; Apply 1 Application topically  2 (two) times daily.  Dispense: 45 g; Refill: 0  - Possible dyshidrotic eczema/dermatitis - Triamcinolone prescribed - Keep hands clean and dry - Avoid harsh chemicals/cleaning chemicals without wearing gloves - Avoid leaving hands in hot water long periods of time - Moisturize skin well - Follow up if not improving or if symptoms worsen  Follow Up Instructions: I discussed the assessment and treatment plan with the patient. The patient was provided an opportunity to ask questions and all were answered. The patient agreed with the plan and demonstrated an understanding of the instructions.  A copy of instructions were sent to the patient via MyChart unless otherwise noted below.    The patient was advised to call back or seek an in-person evaluation if the symptoms worsen or if the condition fails to improve as anticipated.    [name], PA-C

## 2023-10-20 ENCOUNTER — Encounter: Payer: Self-pay | Admitting: Family Medicine

## 2023-10-20 ENCOUNTER — Other Ambulatory Visit (HOSPITAL_COMMUNITY)
Admission: RE | Admit: 2023-10-20 | Discharge: 2023-10-20 | Disposition: A | Discharge status: Home / Self Care | Source: Ambulatory Visit | Attending: Family Medicine | Admitting: Family Medicine

## 2023-10-20 ENCOUNTER — Ambulatory Visit (INDEPENDENT_AMBULATORY_CARE_PROVIDER_SITE_OTHER): Admitting: Family Medicine

## 2023-10-20 VITALS — BP 112/64 | HR 95 | Resp 16 | Ht 64.75 in | Wt 120.4 lb

## 2023-10-20 DIAGNOSIS — N898 Other specified noninflammatory disorders of vagina: Secondary | ICD-10-CM

## 2023-10-20 MED ORDER — METRONIDAZOLE 0.75 % VA GEL
1.0000 | Freq: Two times a day (BID) | VAGINAL | 0 refills | Status: DC
Start: 2023-10-20 — End: 2024-01-30

## 2023-10-20 NOTE — Progress Notes (Signed)
 Name: Melissa Hensley   MRN: [id number]    DOB: 2001/04/18   Date:10/20/2023       Progress Note  Subjective  Chief Complaint  Chief Complaint  Patient presents with   vaginal odor    On going for a month    Discussed the use of AI scribe software for clinical note transcription with the patient, who gave verbal consent to proceed.  History of Present Illness Melissa Hensley is a 23 year old female who presents with vaginal odor.  She has been experiencing a fishy vaginal odor for approximately one month, which began about a week before her last menstrual cycle on October 10, 2023. The odor persisted through her menstrual period and has not resolved since. No associated vaginal discharge, pelvic pain, or lower abdominal pain. She has a history of similar symptoms in the past, for which she was treated, but she is unsure if she was diagnosed with bacterial vaginosis (BV) previously.  She is currently breastfeeding and has avoided using boric acid due to concerns about its safety while breastfeeding. She has not used tampons and denies any possibility of a foreign object being retained. She has been in a stable sexual relationship for two years and does not report any exacerbation of symptoms following sexual intercourse.  In terms of her gynecological history, she had a full-term vaginal delivery on March 12, 2023, with no complications or need for surgical intervention. She reports no issues postpartum and denies any fever or changes in appetite. Her last blood work in January 2025, including an HIV test, was negative.    Patient Active Problem List   Diagnosis Date Noted   ASCUS of cervix with negative high risk HPV 05/01/2023   Gestational hypertension without significant proteinuria in third trimester 03/13/2023   Vapes nicotine containing substance 11/24/2020   Plantar fasciitis 09/13/2020   Moderate episode of recurrent major depressive disorder (HCC) 08/26/2018   Grieving 08/26/2018    Problem with child being bullied 08/26/2018   Picky eater 04/23/2016   Vitamin D deficiency 04/23/2016    Social History   Tobacco Use   Smoking status: Former    Types: E-cigarettes    Quit date: 06/25/2022    Years since quitting: 1.3   Smokeless tobacco: Never  Substance Use Topics   Alcohol use: Yes    Comment: occasion     Current Outpatient Medications:    sulfamethoxazole-trimethoprim (BACTRIM) 400-80 MG tablet, Take 1 tablet by mouth 2 (two) times daily. (Patient not taking: Reported on 10/20/2023), Disp: 6 tablet, Rfl: 0   triamcinolone cream (KENALOG) 0.1 %, Apply 1 Application topically 2 (two) times daily. (Patient not taking: Reported on 10/20/2023), Disp: 45 g, Rfl: 0  Allergies  Allergen Reactions   Red Dye #40 (Allura Red) Diarrhea    ROS  Ten systems reviewed and is negative except as mentioned in HPI    Objective  Vitals:   10/20/23 0807  BP: 112/64  Pulse: 95  Resp: 16  SpO2: 97%  Weight: 120 lb 6.4 oz (54.6 kg)  Height: 5' 4.75" (1.645 m)    Body mass index is 20.19 kg/m.  Physical Exam CONSTITUTIONAL: Patient appears well-developed and well-nourished. No distress. HEENT: Head atraumatic, normocephalic, neck supple. CARDIOVASCULAR: Normal rate, regular rhythm and normal heart sounds. No murmur heard. No BLE edema. PULMONARY: Effort normal and breath sounds normal. No respiratory distress. ABDOMINAL: There is no tenderness or distention. MUSCULOSKELETAL: Normal gait. Without gross motor or sensory deficit. PSYCHIATRIC: Patient has  a normal mood and affect. Behavior is normal. Judgment and thought content normal. GENITOURINARY: Cervix slightly erythematous , vagina with white discharge, no odor detected. Bimanual exam no motion tenderness   Recent Results (from the past 2160 hours)  Cervicovaginal ancillary only     Status: None   Collection Time: 07/29/23  8:52 AM  Result Value Ref Range   Neisseria Gonorrhea Negative    Chlamydia Negative     Trichomonas Negative    Bacterial Vaginitis (gardnerella) Negative    Comment      Normal Reference Range Bacterial Vaginosis - Negative   Comment Normal Reference Range Trichomonas - Negative    Comment Normal Reference [place] Chlamydia - Negative    Comment      Normal Reference Range Neisseria Gonorrhea - Negative  RPR     Status: None   Collection Time: 07/29/23  9:15 AM  Result Value Ref Range   RPR Ser Ql NON-REACTIVE NON-REACTIVE    Comment: . No laboratory evidence of syphilis. If recent exposure is suspected, submit a new sample in 2-4 weeks. Marland Kitchen   HIV Antibody (routine testing w rflx)     Status: None   Collection Time: 07/29/23  9:15 AM  Result Value Ref Range   HIV 1&2 Ab, 4th Generation NON-REACTIVE NON-REACTIVE    Comment: HIV-1 antigen and HIV-1/HIV-2 antibodies were not detected. There is no laboratory evidence of HIV infection. Marland Kitchen PLEASE NOTE: This information has been disclosed to you from records whose confidentiality may be protected by state law.  If your state requires such protection, then the state law prohibits you from making any further disclosure of the information without the specific written consent of the person to whom it pertains, or as otherwise permitted by law. A general authorization for the release of medical or other information is NOT sufficient for this purpose. . For additional information please refer to http://education.questdiagnostics.com/faq/FAQ106 (This link is being provided for informational/ educational purposes only.) . Marland Kitchen The performance of this assay has not been clinically validated in patients less than 60 years old. Marland Kitchen   CBC with Differential/Platelet     Status: Abnormal   Collection Time: 07/29/23  9:15 AM  Result Value Ref Range   WBC 4.7 3.8 - 10.8 Thousand/uL   RBC 5.14 (H) 3.80 - 5.10 Million/uL   Hemoglobin 14.6 11.7 - 15.5 g/dL   HCT 16.1 09.6 - 04.5 %   MCV 85.6 80.0 - 100.0 fL   MCH 28.4 27.0 - 33.0  pg   MCHC 33.2 32.0 - 36.0 g/dL    Comment: For adults, a slight decrease in the calculated MCHC value (in the range of 30 to 32 g/dL) is most likely not clinically significant; however, it should be interpreted with caution in correlation with other red cell parameters and the patient's clinical condition.    RDW 11.8 11.0 - 15.0 %   Platelets 331 140 - 400 Thousand/uL   MPV 9.9 7.5 - 12.5 fL   Neutro Abs 2,745 1,500 - 7,800 cells/uL   Absolute Lymphocytes 1,434 850 - 3,900 cells/uL   Absolute Monocytes 442 200 - 950 cells/uL   Eosinophils Absolute 52 15 - 500 cells/uL   Basophils Absolute 28 0 - 200 cells/uL   Neutrophils Relative % 58.4 %   Total Lymphocyte 30.5 %   Monocytes Relative 9.4 %   Eosinophils Relative 1.1 %   Basophils Relative 0.6 %  COMPLETE METABOLIC PANEL WITH GFR     Status: Abnormal  Collection Time: 07/29/23  9:15 AM  Result Value Ref Range   Glucose, Bld 92 65 - 99 mg/dL    Comment: .            Fasting reference interval .    BUN 13 7 - 25 mg/dL   Creat 9.56 2.13 - 0.86 mg/dL   eGFR 578 > OR = 60 IO/NGE/9.52W4   BUN/Creatinine Ratio SEE NOTE: 6 - 22 (calc)    Comment:    Not Reported: BUN and Creatinine are within    reference range. .    Sodium 139 135 - 146 mmol/L   Potassium 4.4 3.5 - 5.3 mmol/L   Chloride 109 98 - 110 mmol/L   CO2 25 20 - 32 mmol/L   Calcium 9.9 8.6 - 10.2 mg/dL   Total Protein 7.3 6.1 - 8.1 g/dL   Albumin 4.6 3.6 - 5.1 g/dL   Globulin 2.7 1.9 - 3.7 g/dL (calc)   AG Ratio 1.7 1.0 - 2.5 (calc)   Total Bilirubin 0.5 0.2 - 1.2 mg/dL   Alkaline phosphatase (APISO) 137 (H) 31 - 125 U/L   AST 14 10 - 30 U/L   ALT 14 6 - 29 U/L  CULTURE, URINE COMPREHENSIVE     Status: Abnormal   Collection Time: 07/29/23  9:15 AM   Specimen: Urine  Result Value Ref Range   MICRO NUMBER: [id number]    SPECIMEN QUALITY: Adequate    Source OTHER (SPECIFY)    STATUS: FINAL    ISOLATE 1: Escherichia coli (A)     Comment: 50,000-100,000  CFU/mL of Escherichia coli      Susceptibility   Escherichia coli - CULT, URN, SPECIAL NEGATIVE 1    AMOX/CLAVULANIC 8 Sensitive     AMPICILLIN 8 Sensitive     AMPICILLIN/SULBACTAM 4 Sensitive     CEFAZOLIN* <=4 Not Reportable      * For infections other than uncomplicated UTI caused by E. coli, K. pneumoniae or P. mirabilis: Cefazolin is resistant if MIC > or = 8 mcg/mL. (Distinguishing susceptible versus intermediate for isolates with MIC < or = 4 mcg/mL requires additional testing.) For uncomplicated UTI caused by E. coli, K. pneumoniae or P. mirabilis: Cefazolin is susceptible if MIC <32 mcg/mL and predicts susceptible to the oral agents cefaclor, cefdinir, cefpodoxime, cefprozil, cefuroxime, cephalexin and loracarbef.     CEFTAZIDIME <=1 Sensitive     CEFEPIME <=1 Sensitive     CEFTRIAXONE <=1 Sensitive     CIPROFLOXACIN <=0.25 Sensitive     LEVOFLOXACIN <=0.12 Sensitive     GENTAMICIN <=1 Sensitive     IMIPENEM <=0.25 Sensitive     NITROFURANTOIN <=16 Sensitive     PIP/TAZO <=4 Sensitive     TOBRAMYCIN <=1 Sensitive     TRIMETH/SULFA* <=20 Sensitive      * For infections other than uncomplicated UTI caused by E. coli, K. pneumoniae or P. mirabilis: Cefazolin is resistant if MIC > or = 8 mcg/mL. (Distinguishing susceptible versus intermediate for isolates with MIC < or = 4 mcg/mL requires additional testing.) For uncomplicated UTI caused by E. coli, K. pneumoniae or P. mirabilis: Cefazolin is susceptible if MIC <32 mcg/mL and predicts susceptible to the oral agents cefaclor, cefdinir, cefpodoxime, cefprozil, cefuroxime, cephalexin and loracarbef. Legend: S = Susceptible  I = Intermediate R = Resistant  NS = Not susceptible SDD = Susceptible Dose Dependent * = Not Tested  NR = Not Reported **NN = See Therapy Comments     Assessment &  Plan Bacterial Vaginosis (BV) BV likely due to fishy odor without discharge. Topical treatment preferred due to  breastfeeding. - Prescribed topical metronidazole, one applicator full vaginally every night. - Advised against oral metronidazole due to higher systemic absorption. - Discussed using a panty liner to prevent leakage post-application. - Informed that topical metronidazole is safer during breastfeeding.

## 2023-10-22 ENCOUNTER — Encounter: Payer: Self-pay | Admitting: Family Medicine

## 2023-10-22 LAB — CERVICOVAGINAL ANCILLARY ONLY
Bacterial Vaginitis (gardnerella): POSITIVE — AB
Candida Glabrata: NEGATIVE
Candida Vaginitis: NEGATIVE
Chlamydia: NEGATIVE
Comment: NEGATIVE
Comment: NEGATIVE
Comment: NEGATIVE
Comment: NEGATIVE
Comment: NEGATIVE
Comment: NORMAL
Neisseria Gonorrhea: NEGATIVE
Trichomonas: NEGATIVE

## 2024-01-29 ENCOUNTER — Telehealth: Payer: Self-pay

## 2024-01-29 NOTE — Telephone Encounter (Signed)
 Copied from CRM [id number]. Topic: Clinical - Medical Advice >> Jan 29, 2024 10:30 AM Gustabo D wrote: Patient has a smell and was prescribed medication and says it didn't clear up. Patient wants to know if she can get more medication.

## 2024-01-29 NOTE — Telephone Encounter (Signed)
 Spoke with pt and tried to schedule her an appointment. Pt will have to call back she was not able to accept any time slots that was offered even with different providers

## 2024-01-29 NOTE — Telephone Encounter (Signed)
 Needs appt

## 2024-01-30 ENCOUNTER — Other Ambulatory Visit (HOSPITAL_COMMUNITY)
Admission: RE | Admit: 2024-01-30 | Discharge: 2024-01-30 | Disposition: A | Discharge status: Home / Self Care | Source: Ambulatory Visit | Attending: Family Medicine | Admitting: Family Medicine

## 2024-01-30 ENCOUNTER — Encounter: Payer: Self-pay | Admitting: Family Medicine

## 2024-01-30 ENCOUNTER — Ambulatory Visit (INDEPENDENT_AMBULATORY_CARE_PROVIDER_SITE_OTHER): Admitting: Family Medicine

## 2024-01-30 VITALS — BP 102/64 | HR 64 | Resp 16 | Ht 64.75 in | Wt 109.9 lb

## 2024-01-30 DIAGNOSIS — Z113 Encounter for screening for infections with a predominantly sexual mode of transmission: Secondary | ICD-10-CM

## 2024-01-30 DIAGNOSIS — E559 Vitamin D deficiency, unspecified: Secondary | ICD-10-CM | POA: Diagnosis not present

## 2024-01-30 DIAGNOSIS — N898 Other specified noninflammatory disorders of vagina: Secondary | ICD-10-CM | POA: Diagnosis not present

## 2024-01-30 DIAGNOSIS — F325 Major depressive disorder, single episode, in full remission: Secondary | ICD-10-CM | POA: Diagnosis not present

## 2024-01-30 MED ORDER — METRONIDAZOLE 0.75 % VA GEL: 1.0000 | Freq: Two times a day (BID) | VAGINAL | 0 refills | Status: DC

## 2024-01-30 NOTE — Progress Notes (Signed)
 Name: Melissa Hensley   MRN: [id number]    DOB: 10-06-2000   Date:01/30/2024       Progress Note  Subjective  Chief Complaint  Chief Complaint  Patient presents with   vaginal odor    Ongoing for a month   Discussed the use of AI scribe software for clinical note transcription with the patient, who gave verbal consent to proceed.  History of Present Illness Melissa Hensley is a 23 year old female who presents with persistent vaginal odor.  She has been experiencing a persistent vaginal odor for the past month. She has a history of bacterial vaginosis and last received treatment for it in April 2025. She attempted to use boric acid as a treatment, but it was ineffective. Her symptoms are not continuous and typically resolve with treatment, but the odor has persisted this time. No new sexual partners and does not use condoms during intercourse.  She is currently breastfeeding her two-month-old baby and prefers to use vaginal medication for treatment due to breastfeeding. She has not been taking vitamin D  supplements despite a previous low vitamin D  level.  Her social history includes working from 4 PM to 12 AM at [employer], with her mother taking care of her baby during work hours. She denies current depression but acknowledges having down days, attributing them to the challenges of being a new mom. She has a history of recurrent major depression as a teenager and denies any suicidal thoughts.  She had gestational hypertension during her pregnancy and a history of ASCUS on Pap smear; her postpartum Pap smear showed ASCUS, but she was told it was okay. No current plantar fasciitis and no longer considers herself a picky eater. She has stopped vaping.    Patient Active Problem List   Diagnosis Date Noted   ASCUS of cervix with negative high risk HPV 05/01/2023   Gestational hypertension without significant proteinuria in third trimester 03/13/2023   Moderate episode of recurrent major  depressive disorder (HCC) 08/26/2018   Vitamin D  deficiency 04/23/2016    History reviewed. No pertinent surgical history.  Family History  Problem Relation Age of Onset   Diabetes Paternal Uncle     Social History   Tobacco Use   Smoking status: Former    Types: E-cigarettes    Quit date: 06/25/2022    Years since quitting: 1.6   Smokeless tobacco: Never  Substance Use Topics   Alcohol use: Yes    Comment: occasion     Current Outpatient Medications:    Cholecalciferol (VITAMIN D ) 50 MCG (2000 UT) CAPS, Take 1 capsule by mouth daily., Disp: , Rfl:    metroNIDAZOLE  (METROGEL ) 0.75 % vaginal gel, Place 1 Applicatorful vaginally 2 (two) times daily. (Patient not taking: Reported on 01/30/2024), Disp: 70 g, Rfl: 0  Allergies  Allergen Reactions   Red Dye #40 (Allura Red) Diarrhea    I personally reviewed active problem list, medication list, allergies with the patient/caregiver today.   ROS  Ten systems reviewed and is negative except as mentioned in HPI    Objective Physical Exam CONSTITUTIONAL: Patient appears well-developed and well-nourished.  No distress. HEENT: Head atraumatic, normocephalic, neck supple. CARDIOVASCULAR: Normal rate, regular rhythm and normal heart sounds.  No murmur heard. No BLE edema. PULMONARY: Effort normal and breath sounds normal. No respiratory distress. ABDOMINAL: There is no tenderness or distention. MUSCULOSKELETAL: Normal gait. Without gross motor or sensory deficit. PSYCHIATRIC: Patient has a normal mood and affect. behavior is normal. Judgment and thought  content normal.  Vitals:   01/30/24 0859  BP: 102/64  Pulse: 64  Resp: 16  SpO2: 99%  Weight: 109 lb 14.4 oz (49.9 kg)  Height: 5' 4.75 (1.645 m)    Body mass index is 18.43 kg/m.    PHQ2/9:    01/30/2024    8:55 AM 10/20/2023    8:03 AM 07/29/2023    8:26 AM 08/28/2022   10:03 AM 07/26/2022    8:12 AM  Depression screen PHQ 2/9  Decreased Interest 0 0 0 0 0   Down, Depressed, Hopeless 0 0 0 0 0  PHQ - 2 Score 0 0 0 0 0  Altered sleeping  0 0  0  Tired, decreased energy  0 0  0  Change in appetite  0 0  0  Feeling bad or failure about yourself   0 0  0  Trouble concentrating  0 0  0  Moving slowly or fidgety/restless  0 0  0  Suicidal thoughts  0 0  0  PHQ-9 Score  0 0  0  Difficult doing work/chores  Not difficult at all Not difficult at all  Not difficult at all    phq 9 is negative  Fall Risk:    01/30/2024    8:55 AM 07/29/2023    8:25 AM 08/28/2022   10:03 AM 07/26/2022    8:11 AM 09/24/2021    3:52 PM  Fall Risk   Falls in the past year? 0 0 0 0 0  Number falls in past yr: 0 0 0 0 0  Injury with Fall? 0 0 0 0 0  Risk for fall due to : No Fall Risks No Fall Risks  No Fall Risks No Fall Risks  Follow up Falls evaluation completed Falls prevention discussed;Education provided;Falls evaluation completed  Falls prevention discussed;Education provided;Falls evaluation completed  Falls prevention discussed      Data saved with a previous flowsheet row definition      Assessment & Plan Vaginal odor Recurrent bacterial vaginosis with vaginal odor. Previous boric acid treatment ineffective. History of successful treatment with vaginal metronidazole  gel. Breastfeeding influences treatment choice. - Prescribe metronidazole  vaginal gel. - Order STI screening including gonorrhea, chlamydia, and trichomonas. - Advised to avoid breastfeeding for 12-24 hours if taking high-dose oral metronidazole  in the future.  Major depressive disorder, recurrent, in remission Recurrent major depressive disorder in remission. Occasional down days attributed to new motherhood. No suicidal ideation or need for psychiatric medication. PHQ-9 screening negative for significant depressive symptoms.  Vitamin D  deficiency Vitamin D  deficiency. Not taking supplements. Discussed importance for bone health, especially due to petite stature and breastfeeding. -  Recommend over-the-counter vitamin D  supplementation, 2000 IU daily.

## 2024-02-02 ENCOUNTER — Ambulatory Visit: Payer: Self-pay | Admitting: Family Medicine

## 2024-02-02 LAB — CERVICOVAGINAL ANCILLARY ONLY
Bacterial Vaginitis (gardnerella): POSITIVE — AB
Candida Glabrata: NEGATIVE
Candida Vaginitis: NEGATIVE
Chlamydia: NEGATIVE
Comment: NEGATIVE
Comment: NEGATIVE
Comment: NEGATIVE
Comment: NEGATIVE
Comment: NEGATIVE
Comment: NORMAL
Neisseria Gonorrhea: NEGATIVE
Trichomonas: NEGATIVE

## 2024-03-18 ENCOUNTER — Encounter: Payer: Self-pay | Admitting: *Deleted

## 2024-03-18 ENCOUNTER — Ambulatory Visit
Admission: EM | Admit: 2024-03-18 | Discharge: 2024-03-18 | Disposition: A | Discharge status: Home / Self Care | Attending: Emergency Medicine | Admitting: Emergency Medicine

## 2024-03-18 DIAGNOSIS — H00014 Hordeolum externum left upper eyelid: Secondary | ICD-10-CM

## 2024-03-18 MED ORDER — ERYTHROMYCIN 5 MG/GM OP OINT: TOPICAL_OINTMENT | OPHTHALMIC | 0 refills | Status: DC

## 2024-03-18 NOTE — ED Triage Notes (Signed)
 Patient states 2 days of left upper lid pain and swelling, no drainage, no vision changes

## 2024-03-18 NOTE — Discharge Instructions (Addendum)
 Apply ice to face 20 min 3 x daily. Use erythromycin  ointment as label directed, wash hands before and after use of medication.  Follow-up with your PCP/eye doctor in 2 to 3 days, sooner if worse  If you have new or worsening symptoms, or loss of vision, go to the ER for further evaluation
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
## 2024-05-10 ENCOUNTER — Ambulatory Visit
Admission: EM | Admit: 2024-05-10 | Discharge: 2024-05-10 | Disposition: A | Discharge status: Home / Self Care | Attending: Emergency Medicine | Admitting: Emergency Medicine

## 2024-05-10 ENCOUNTER — Ambulatory Visit (INDEPENDENT_AMBULATORY_CARE_PROVIDER_SITE_OTHER)

## 2024-05-10 DIAGNOSIS — M25512 Pain in left shoulder: Secondary | ICD-10-CM
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
## 2024-05-10 NOTE — Discharge Instructions (Signed)
 Your x-rays did not show any evidence of arthritis or broken bones.  Also no evidence of dislocation.  Use over-the-counter Tylenol and or ibuprofen according to the package instructions as needed for any pain or inflammation.  You may apply ice to your shoulder for 20 to the time, 2-3 times a day, to help with pain and inflammation.  Follow the physical therapy exercises given in your discharge instructions to help maintain mobility and aid in pain relief.  If you do not have any improvements in your pain in the next 7 to 10 days recommend following up with orthopedics such as [facility] here in [place] or in [place].

## 2024-05-10 NOTE — ED Triage Notes (Signed)
 Patient states that she rolled over on her left shoulder this morning, heard cracking and is now having pain.

## 2024-05-11 ENCOUNTER — Ambulatory Visit: Payer: Self-pay

## 2024-05-11 ENCOUNTER — Ambulatory Visit (HOSPITAL_COMMUNITY): Payer: Self-pay

## 2024-06-02 ENCOUNTER — Telehealth: Payer: Self-pay

## 2024-06-02 DIAGNOSIS — N3000 Acute cystitis without hematuria: Secondary | ICD-10-CM

## 2024-06-02 NOTE — Progress Notes (Signed)
 Complex Care Management Note Care Guide Note  06/02/2024 Name: Melissa Hensley MRN: [id number] DOB: 2001/05/29   Complex Care Management Outreach Attempts: An unsuccessful telephone outreach was attempted today to offer the patient information about available complex care management services.  Follow Up Plan:  Additional outreach attempts will be made to offer the patient complex care management information and services.   Encounter Outcome:  No Answer  Dreama [facility]  [facility], [facility] VBCI Assistant Direct Dial: [phone]  Fax: [phone]

## 2024-06-04 NOTE — Progress Notes (Unsigned)
 Complex Care Management Note Care Guide Note  06/04/2024 Name: Melissa Hensley MRN: [id number] DOB: July 28, 2000   Complex Care Management Outreach Attempts: A second unsuccessful outreach was attempted today to offer the patient with information about available complex care management services.  Follow Up Plan:  Additional outreach attempts will be made to offer the patient complex care management information and services.   Encounter Outcome:  Patient Request to Call Back  Dreama [facility]  [facility], [facility] VBCI Assistant Direct Dial: [phone]  Fax: [phone]

## 2024-06-07 NOTE — Progress Notes (Signed)
 Complex Care Management Note Care Guide Note  06/07/2024 Name: Melissa Hensley MRN: [id number] DOB: 08/31/2000   Complex Care Management Outreach Attempts: A third unsuccessful outreach was attempted today to offer the patient with information about available complex care management services.  Follow Up Plan:  No further outreach attempts will be made at this time. We have been unable to contact the patient to offer or enroll patient in complex care management services.  Encounter Outcome:  No Answer  [facility]a [facility]  [facility], [facility] VBCI Assistant Direct Dial: [phone]  Fax: [phone]

## 2024-06-15 ENCOUNTER — Ambulatory Visit: Payer: Self-pay

## 2024-06-15 NOTE — Telephone Encounter (Signed)
 FYI Only or Action Required?: FYI only for provider: appointment scheduled on 06/16/24.  Patient was last seen in primary care on 01/30/2024 by Glenard Mire, MD.  Called Nurse Triage reporting Cough, Nasal Congestion, and Fatigue.  Symptoms began several days ago.  Interventions attempted: OTC medications: Muccinex taken on Sunday only.  Symptoms are: gradually worsening.  Triage Disposition: See Physician Within 24 Hours (overriding Home Care)  Patient/caregiver understands and will follow disposition?: Yes             Copied from CRM [id number]. Topic: Clinical - Red Word Triage >> Jun 15, 2024  9:42 AM Kevelyn M wrote: Red Word that prompted transfer to Nurse Triage: Cough, chest hurts when she coughs, running nose, and it's hard to breathe when she coughs. Symptoms started Sunday. Reason for Disposition  Cough with cold symptoms (e.g., runny nose, postnasal drip, throat clearing)  Answer Assessment - Initial Assessment Questions 1. ONSET: When did the cough begin?      Sunday. Treated with muccinex on Sunday.  2. SEVERITY: How bad is the cough today?      She states the cough worsened today, coughing fits about 10 seconds long and causes SOB and chest pain.  3. SPUTUM: Describe the color of your sputum (e.g., none, dry cough; clear, white, yellow, green)     Wet cough not able to produce mucous.  4. HEMOPTYSIS: Are you coughing up any blood? If Yes, ask: How much? (e.g., flecks, streaks, tablespoons, etc.)     No.  5. DIFFICULTY BREATHING: Are you having difficulty breathing? If Yes, ask: How bad is it? (e.g., mild, moderate, severe)      Only when coughing, no SOB at rest.  6. FEVER: Do you have a fever? If Yes, ask: What is your temperature, how was it measured, and when did it start?     No.  7. CARDIAC HISTORY: Do you have any history of heart disease? (e.g., heart attack, congestive heart failure)      No.  8. LUNG HISTORY: Do you  have any history of lung disease?  (e.g., pulmonary embolus, asthma, emphysema)     No.  9. PE RISK FACTORS: Do you have a history of blood clots? (or: recent major surgery, recent prolonged travel, bedridden)     No.  10. OTHER SYMPTOMS: Do you have any other symptoms? (e.g., runny nose, wheezing, chest pain)       Runny nose, nasal congestion at night. Denies wheezing or earaches.  11. PREGNANCY: Is there any chance you are pregnant? When was your last menstrual period?       LMP: this past week.  12. TRAVEL: Have you traveled out of the country in the last month? (e.g., travel history, exposures)       No.  Patient states she has only taken Muccinex on Sunday, no other OTC treatment. Patient requesting virtual visit or appt with provider. RN advised patient to take home flu and COVID test and call back with positive results, advised her to wear a mask for appt tomorrow.  Protocols used: Cough - Acute Productive-A-AH

## 2024-06-16 ENCOUNTER — Ambulatory Visit: Admitting: Family Medicine

## 2024-07-30 ENCOUNTER — Encounter: Payer: Self-pay | Admitting: Family Medicine

## 2024-08-04 NOTE — Patient Instructions (Signed)

## 2024-08-06 ENCOUNTER — Other Ambulatory Visit (HOSPITAL_COMMUNITY)
Admission: RE | Admit: 2024-08-06 | Discharge: 2024-08-06 | Disposition: A | Source: Ambulatory Visit | Attending: Family Medicine | Admitting: Family Medicine

## 2024-08-06 ENCOUNTER — Other Ambulatory Visit: Payer: Self-pay

## 2024-08-06 ENCOUNTER — Ambulatory Visit: Admitting: Family Medicine

## 2024-08-06 ENCOUNTER — Encounter: Payer: Self-pay | Admitting: Family Medicine

## 2024-08-06 VITALS — BP 108/66 | HR 93 | Resp 16 | Ht 64.75 in | Wt 103.1 lb

## 2024-08-06 DIAGNOSIS — Z113 Encounter for screening for infections with a predominantly sexual mode of transmission: Secondary | ICD-10-CM

## 2024-08-06 DIAGNOSIS — Z0001 Encounter for general adult medical examination with abnormal findings: Secondary | ICD-10-CM

## 2024-08-06 DIAGNOSIS — Z308 Encounter for other contraceptive management: Secondary | ICD-10-CM

## 2024-08-06 DIAGNOSIS — Z0000 Encounter for general adult medical examination without abnormal findings: Secondary | ICD-10-CM | POA: Insufficient documentation

## 2024-08-06 DIAGNOSIS — Z3042 Encounter for surveillance of injectable contraceptive: Secondary | ICD-10-CM | POA: Diagnosis not present

## 2024-08-06 DIAGNOSIS — Z1159 Encounter for screening for other viral diseases: Secondary | ICD-10-CM

## 2024-08-06 DIAGNOSIS — R634 Abnormal weight loss: Secondary | ICD-10-CM | POA: Diagnosis not present

## 2024-08-06 LAB — CERVICOVAGINAL ANCILLARY ONLY
Bacterial Vaginitis (gardnerella): POSITIVE — AB
Candida Glabrata: NEGATIVE
Candida Vaginitis: NEGATIVE
Chlamydia: NEGATIVE
Comment: NEGATIVE
Comment: NEGATIVE
Comment: NEGATIVE
Comment: NEGATIVE
Comment: NEGATIVE
Comment: NORMAL
Neisseria Gonorrhea: NEGATIVE
Trichomonas: POSITIVE — AB

## 2024-08-06 MED ORDER — MEDROXYPROGESTERONE ACETATE 150 MG/ML IM SUSY
150.0000 mg | PREFILLED_SYRINGE | INTRAMUSCULAR | 3 refills | Status: AC
  Filled 2024-08-06: qty 1, 90d supply, fill #0
  Filled 2024-08-06: qty 1, 84d supply, fill #0

## 2024-08-06 MED ORDER — MEDROXYPROGESTERONE ACETATE 150 MG/ML IM SUSY
150.0000 mg | PREFILLED_SYRINGE | INTRAMUSCULAR | Status: AC
  Administered 2024-08-06: 150 mg via INTRAMUSCULAR

## 2024-08-06 NOTE — Addendum Note (Signed)
 Addended by: YVONE WARREN BROCKS on: 08/06/2024 11:45 AM   Modules accepted: Orders

## 2024-08-06 NOTE — Progress Notes (Signed)
 Name: Melissa Hensley   MRN: [id number]    DOB: 05/07/2001   Date:08/06/2024       Progress Note  Subjective  Chief Complaint  Chief Complaint  Patient presents with   Annual Exam    HPI  Patient presents for annual CPE.  Diet: eating 3 meals a day, trying to increase protein intake, states not a picky eater but lost 7 lbs in the past 6 months  Exercise: discussed importance of strength training   Last Eye Exam: encouraged to complete Last Dental Exam: encouraged to complete  Flowsheet Row Office Visit from 08/06/2024 in [facility]  AUDIT-C Score 1   Depression: Phq 9 is  negative    08/06/2024   10:05 AM 01/30/2024    8:55 AM 10/20/2023    8:03 AM 07/29/2023    8:26 AM 08/28/2022   10:03 AM  Depression screen PHQ 2/9  Decreased Interest 0 0 0 0 0  Down, Depressed, Hopeless 0 0 0 0 0  PHQ - 2 Score 0 0 0 0 0  Altered sleeping 0 0 0 0   Tired, decreased energy 0 2 0 0   Change in appetite 0 0 0 0   Feeling bad or failure about yourself  0 0 0 0   Trouble concentrating 0 0 0 0   Moving slowly or fidgety/restless 0 0 0 0   Suicidal thoughts 0 0 0 0   PHQ-9 Score 0 2  0  0    Difficult doing work/chores Not difficult at all Somewhat difficult Not difficult at all Not difficult at all      Data saved with a previous flowsheet row definition   Hypertension: BP Readings from Last 3 Encounters:  08/06/24 108/66  05/10/24 110/72  03/18/24 110/75   Obesity: Wt Readings from Last 3 Encounters:  08/06/24 103 lb 1.6 oz (46.8 kg)  05/10/24 103 lb (46.7 kg)  03/18/24 106 lb 4.8 oz (48.2 kg)   BMI Readings from Last 3 Encounters:  08/06/24 17.29 kg/m  05/10/24 17.27 kg/m  03/18/24 17.83 kg/m     Vaccines: reviewed with the patient.   Hep C Screening: completed STD testing and prevention (HIV/chl/gon/syphilis): today  Intimate partner violence: negative screen  Sexual History : one partner for the past 2 years, female, not using  condoms Menstrual History/LMP/Abnormal Bleeding: cycles are regular , lasts 7 days , medium flow  Discussed importance of follow up if any post-menopausal bleeding: not applicable  Incontinence Symptoms: negative for symptoms   Breast cancer:  - Last Mammogram: N/A - BRCA gene screening:   Osteoporosis Prevention : Discussed high calcium and vitamin D  supplementation, weight bearing exercises Bone density :not applicable  discussed importance of high calcium diet and weight training since starting on Depo   Cervical cancer screening: up-to-date  Skin cancer: Discussed monitoring for atypical lesions    Advanced Care Planning: A voluntary discussion about advance care planning including the explanation and discussion of advance directives.  Discussed health care proxy and Living will, and the patient was able to identify a health care proxy as mother .  Patient does not have a living will and power of attorney of health care   Patient Active Problem List   Diagnosis Date Noted   Hordeolum externum left upper eyelid 03/18/2024   ASCUS of cervix with negative high risk HPV 05/01/2023   Gestational hypertension without significant proteinuria in third trimester 03/13/2023   Moderate episode of recurrent major  depressive disorder (HCC) 08/26/2018   Vitamin D  deficiency 04/23/2016    History reviewed. No pertinent surgical history.  Family History  Problem Relation Age of Onset   Diabetes Paternal Uncle     Social History   Socioeconomic History   Marital status: Single    Spouse name: Not on file   Number of children: 1   Years of education: Not on file   Highest education level: Not on file  Occupational History   Occupation: student     Employer: [employer]  Tobacco Use   Smoking status: Every Day    Types: Cigars   Smokeless tobacco: Never   Tobacco comments:    Black and mild   Vaping Use   Vaping status: Former   Substances: Nicotine, Flavoring  Substance  and Sexual Activity   Alcohol use: Yes    Comment: occasion   Drug use: Never   Sexual activity: Yes    Partners: Male    Birth control/protection: None  Other Topics Concern   Not on file  Social History Narrative   Parents divorced    Moved back with her mother and one brother    Social Drivers of Health   Tobacco Use: High Risk (08/06/2024)   Patient History    Smoking Tobacco Use: Every Day    Smokeless Tobacco Use: Never    Passive Exposure: Not on file  Financial Resource Strain: Low Risk (08/06/2024)   Overall Financial Resource Strain (CARDIA)    Difficulty of Paying Living Expenses: Not hard at all  Food Insecurity: No Food Insecurity (08/06/2024)   Epic    Worried About Radiation Protection Practitioner of Food in the Last Year: Never true    Ran Out of Food in the Last Year: Never true  Transportation Needs: No Transportation Needs (08/06/2024)   Epic    Lack of Transportation (Medical): No    Lack of Transportation (Non-Medical): No  Physical Activity: Inactive (08/06/2024)   Exercise Vital Sign    Days of Exercise per Week: 0 days    Minutes of Exercise per Session: 0 min  Stress: No Stress Concern Present (08/06/2024)   [employer] of Occupational Health - Occupational Stress Questionnaire    Feeling of Stress: Only a little  Social Connections: Socially Isolated (08/06/2024)   Social Connection and Isolation Panel    Frequency of Communication with Friends and Family: More than three times a week    Frequency of Social Gatherings with Friends and Family: More than three times a week    Attends Religious Services: Never    Database Administrator or Organizations: No    Attends Banker Meetings: Never    Marital Status: Never married  Intimate Partner Violence: Not At Risk (08/06/2024)   Epic    Fear of Current or Ex-Partner: No    Emotionally Abused: No    Physically Abused: No    Sexually Abused: No  Depression (PHQ2-9): Low Risk (08/06/2024)   Depression  (PHQ2-9)    PHQ-2 Score: 0  Alcohol Screen: Low Risk (08/06/2024)   Alcohol Screen    Last Alcohol Screening Score (AUDIT): 1  Housing: Unknown (08/06/2024)   Epic    Unable to Pay for Housing in the Last Year: No    Number of Times Moved in the Last Year: Not on file    Homeless in the Last Year: No  Utilities: Not At Risk (08/06/2024)   Epic    Threatened with loss of  utilities: No  Health Literacy: Adequate Health Literacy (08/06/2024)   B1300 Health Literacy    Frequency of need for help with medical instructions: Never    Current Medications[1]  Allergies[2]   ROS  Ten systems reviewed and is negative except as mentioned in HPI    Objective  Vitals:   08/06/24 1012  BP: 108/66  Pulse: 93  Resp: 16  SpO2: 99%  Weight: 103 lb 1.6 oz (46.8 kg)  Height: 5' 4.75 (1.645 m)    Body mass index is 17.29 kg/m.  Physical Exam  Constitutional: Patient appears well-developed and well-nourished. No distress.  HENT: Head: Normocephalic and atraumatic. Ears: B TMs ok, no erythema or effusion; Nose: Nose normal. Mouth/Throat: Oropharynx is clear and moist. No oropharyngeal exudate.  Eyes: Conjunctivae and EOM are normal. Pupils are equal, round, and reactive to light. No scleral icterus.  Neck: Normal range of motion. Neck supple. No JVD present. No thyromegaly present.  Cardiovascular: Normal rate, regular rhythm and normal heart sounds.  No murmur heard. No BLE edema. Pulmonary/Chest: Effort normal and breath sounds normal. No respiratory distress. Abdominal: Soft. Bowel sounds are normal, no distension. There is no tenderness. no masses Breast: no lumps or masses, no nipple discharge or rashes FEMALE GENITALIA:  Not done  RECTAL: not done  Musculoskeletal: Normal range of motion, no joint effusions. No gross deformities Neurological: he is alert and oriented to person, place, and time. No cranial nerve deficit. Coordination, balance, strength, speech and gait are normal.   Skin: Skin is warm and dry. No rash noted. No erythema.  Psychiatric: Patient has a normal mood and affect. behavior is normal. Judgment and thought content normal.     Assessment & Plan  1. Well adult exam (Primary)  - CBC with Differential/Platelet - Comprehensive metabolic panel with GFR - Cervicovaginal ancillary only - HIV Antibody (routine testing w rflx) - RPR W/RFLX TO RPR TITER, TREPONEMAL AB, SCREEN AND DIAGNOSIS - Hepatitis C Antibody - VITAMIN D  25 Hydroxy (Vit-D Deficiency, Fractures) - B12 and Folate Panel - TSH  2. Screening examination for STI  - Cervicovaginal ancillary only - HIV Antibody (routine testing w rflx) - RPR W/RFLX TO RPR TITER, TREPONEMAL AB, SCREEN AND DIAGNOSIS  3. Need for hepatitis C screening test  - Hepatitis C Antibody  4. Weight loss  - VITAMIN D  25 Hydroxy (Vit-D Deficiency, Fractures) - B12 and Folate Panel - TSH  5. Encounter for other contraceptive management  - medroxyPROGESTERone (DEPO-PROVERA) 150 MG/ML injection; Inject 1 mL (150 mg total) into the muscle every 3 (three) months.  Dispense: 1 mL; Refill: 3    -USPSTF grade A and B recommendations reviewed with patient; age-appropriate recommendations, preventive care, screening tests, etc discussed and encouraged; healthy living encouraged; see AVS for patient education given to patient -Discussed importance of 150 minutes of physical activity weekly, eat two servings of fish weekly, eat one serving of tree nuts ( cashews, pistachios, pecans, almonds.SABRA) every other day, eat 6 servings of fruit/vegetables daily and drink plenty of water and avoid sweet beverages.   -Reviewed Health Maintenance: Yes.       [1]  Current Outpatient Medications:    medroxyPROGESTERone (DEPO-PROVERA) 150 MG/ML injection, Inject 1 mL (150 mg total) into the muscle every 3 (three) months., Disp: 1 mL, Rfl: 3   Cholecalciferol (VITAMIN D ) 50 MCG (2000 UT) CAPS, Take 1 capsule by mouth daily.  (Patient not taking: Reported on 08/06/2024), Disp: , Rfl:  [2]  Allergies Allergen Reactions  Red Dye #40 (Allura Red) Diarrhea

## 2024-08-07 LAB — COMPREHENSIVE METABOLIC PANEL WITH GFR
AG Ratio: 1.6 (calc) (ref 1.0–2.5)
ALT: 12 U/L (ref 6–29)
AST: 15 U/L (ref 10–30)
Albumin: 4.5 g/dL (ref 3.6–5.1)
Alkaline phosphatase (APISO): 59 U/L (ref 31–125)
BUN: 16 mg/dL (ref 7–25)
CO2: 27 mmol/L (ref 20–32)
Calcium: 9.4 mg/dL (ref 8.6–10.2)
Chloride: 107 mmol/L (ref 98–110)
Creat: 0.6 mg/dL (ref 0.50–0.96)
Globulin: 2.8 g/dL (ref 1.9–3.7)
Glucose, Bld: 95 mg/dL (ref 65–99)
Potassium: 4.4 mmol/L (ref 3.5–5.3)
Sodium: 140 mmol/L (ref 135–146)
Total Bilirubin: 0.5 mg/dL (ref 0.2–1.2)
Total Protein: 7.3 g/dL (ref 6.1–8.1)
eGFR: 130 mL/min/{1.73_m2}

## 2024-08-07 LAB — CBC WITH DIFFERENTIAL/PLATELET
Absolute Lymphocytes: 1472 {cells}/uL (ref 850–3900)
Absolute Monocytes: 332 {cells}/uL (ref 200–950)
Basophils Absolute: 20 {cells}/uL (ref 0–200)
Basophils Relative: 0.5 %
Eosinophils Absolute: 40 {cells}/uL (ref 15–500)
Eosinophils Relative: 1 %
HCT: 43.4 % (ref 35.9–46.0)
Hemoglobin: 14 g/dL (ref 11.7–15.5)
MCH: 28.3 pg (ref 27.0–33.0)
MCHC: 32.3 g/dL (ref 31.6–35.4)
MCV: 87.9 fL (ref 81.4–101.7)
MPV: 10 fL (ref 7.5–12.5)
Monocytes Relative: 8.3 %
Neutro Abs: 2136 {cells}/uL (ref 1500–7800)
Neutrophils Relative %: 53.4 %
Platelets: 286 10*3/uL (ref 140–400)
RBC: 4.94 Million/uL (ref 3.80–5.10)
RDW: 11.8 % (ref 11.0–15.0)
Total Lymphocyte: 36.8 %
WBC: 4 10*3/uL (ref 3.8–10.8)

## 2024-08-07 LAB — HIV ANTIBODY (ROUTINE TESTING W REFLEX)
HIV 1&2 Ab, 4th Generation: NONREACTIVE
HIV FINAL INTERPRETATION: NEGATIVE

## 2024-08-07 LAB — VITAMIN D 25 HYDROXY (VIT D DEFICIENCY, FRACTURES): Vit D, 25-Hydroxy: 9 ng/mL — ABNORMAL LOW (ref 30–100)

## 2024-08-07 LAB — B12 AND FOLATE PANEL
Folate: 17.7 ng/mL
Vitamin B-12: 1013 pg/mL (ref 200–1100)

## 2024-08-07 LAB — SYPHILIS: RPR W/REFLEX TO RPR TITER AND TREPONEMAL ANTIBODIES, TRADITIONAL SCREENING AND DIAGNOSIS ALGORITHM: RPR Ser Ql: NONREACTIVE

## 2024-08-07 LAB — HEPATITIS C ANTIBODY: Hepatitis C Ab: NONREACTIVE

## 2024-08-07 LAB — TSH: TSH: 0.54 m[IU]/L

## 2024-08-09 ENCOUNTER — Other Ambulatory Visit: Payer: Self-pay | Admitting: Family Medicine

## 2024-08-09 ENCOUNTER — Ambulatory Visit: Payer: Self-pay | Admitting: Family Medicine

## 2024-08-09 ENCOUNTER — Other Ambulatory Visit (HOSPITAL_COMMUNITY): Payer: Self-pay

## 2024-08-09 MED ORDER — METRONIDAZOLE 500 MG PO TABS: 500.0000 mg | ORAL_TABLET | Freq: Two times a day (BID) | ORAL | 0 refills | Status: AC

## 2024-08-10 ENCOUNTER — Other Ambulatory Visit: Payer: Self-pay | Admitting: Family Medicine

## 2024-08-10 MED ORDER — VITAMIN D (ERGOCALCIFEROL) 1.25 MG (50000 UNIT) PO CAPS: 50000.0000 [IU] | ORAL_CAPSULE | ORAL | 1 refills | Status: AC

## 2025-08-08 ENCOUNTER — Encounter: Admitting: Family Medicine
